# Patient Record
Sex: Female | Born: 1980
Health system: Southern US, Community
[De-identification: ages and names within clinical notes are randomized; demographics above are authoritative.]

## PROBLEM LIST (undated history)

## (undated) DIAGNOSIS — N970 Female infertility associated with anovulation: Secondary | ICD-10-CM

## (undated) DIAGNOSIS — B009 Herpesviral infection, unspecified: Secondary | ICD-10-CM

## (undated) DIAGNOSIS — J302 Other seasonal allergic rhinitis: Secondary | ICD-10-CM

## (undated) DIAGNOSIS — I10 Essential (primary) hypertension: Secondary | ICD-10-CM

## (undated) DIAGNOSIS — E282 Polycystic ovarian syndrome: Secondary | ICD-10-CM

## (undated) DIAGNOSIS — E669 Obesity, unspecified: Secondary | ICD-10-CM

## (undated) DIAGNOSIS — Z8619 Personal history of other infectious and parasitic diseases: Secondary | ICD-10-CM

## (undated) DIAGNOSIS — N83209 Unspecified ovarian cyst, unspecified side: Secondary | ICD-10-CM

## (undated) DIAGNOSIS — N915 Oligomenorrhea, unspecified: Secondary | ICD-10-CM

## (undated) HISTORY — DX: Oligomenorrhea, unspecified: N91.5

## (undated) HISTORY — DX: Polycystic ovarian syndrome: E28.2

## (undated) HISTORY — DX: Herpesviral infection, unspecified: B00.9

## (undated) HISTORY — DX: Female infertility associated with anovulation: N97.0

## (undated) HISTORY — DX: Other seasonal allergic rhinitis: J30.2

## (undated) HISTORY — PX: CYSTECTOMY: SUR359

## (undated) HISTORY — DX: Unspecified ovarian cyst, unspecified side: N83.209

## (undated) HISTORY — DX: Obesity, unspecified: E66.9

## (undated) HISTORY — DX: Essential (primary) hypertension: I10

## (undated) HISTORY — DX: Personal history of other infectious and parasitic diseases: Z86.19

---

## 2000-06-02 ENCOUNTER — Other Ambulatory Visit: Admission: RE | Admit: 2000-06-02 | Discharge: 2000-06-02 | Payer: Self-pay | Admitting: Emergency Medicine

## 2003-06-08 ENCOUNTER — Emergency Department (HOSPITAL_COMMUNITY): Admission: EM | Admit: 2003-06-08 | Discharge: 2003-06-08 | Payer: Self-pay | Admitting: Family Medicine

## 2005-02-28 ENCOUNTER — Emergency Department (HOSPITAL_COMMUNITY): Admission: EM | Admit: 2005-02-28 | Discharge: 2005-02-28 | Payer: Self-pay | Admitting: Emergency Medicine

## 2005-03-21 ENCOUNTER — Emergency Department (HOSPITAL_COMMUNITY): Admission: EM | Admit: 2005-03-21 | Discharge: 2005-03-21 | Payer: Self-pay | Admitting: Emergency Medicine

## 2005-08-11 ENCOUNTER — Other Ambulatory Visit: Admission: RE | Admit: 2005-08-11 | Discharge: 2005-08-11 | Payer: Self-pay | Admitting: Gynecology

## 2006-08-16 ENCOUNTER — Other Ambulatory Visit: Admission: RE | Admit: 2006-08-16 | Discharge: 2006-08-16 | Payer: Self-pay | Admitting: Obstetrics and Gynecology

## 2007-09-27 ENCOUNTER — Other Ambulatory Visit: Admission: RE | Admit: 2007-09-27 | Discharge: 2007-09-27 | Payer: Self-pay | Admitting: Obstetrics and Gynecology

## 2008-08-08 ENCOUNTER — Ambulatory Visit: Payer: Self-pay | Admitting: Family Medicine

## 2008-08-08 DIAGNOSIS — J309 Allergic rhinitis, unspecified: Secondary | ICD-10-CM | POA: Insufficient documentation

## 2008-08-08 DIAGNOSIS — J45909 Unspecified asthma, uncomplicated: Secondary | ICD-10-CM | POA: Insufficient documentation

## 2008-08-08 DIAGNOSIS — E669 Obesity, unspecified: Secondary | ICD-10-CM | POA: Insufficient documentation

## 2008-08-11 ENCOUNTER — Encounter (INDEPENDENT_AMBULATORY_CARE_PROVIDER_SITE_OTHER): Payer: Self-pay | Admitting: *Deleted

## 2008-08-11 LAB — CONVERTED CEMR LAB
ALT: 22 units/L (ref 0–35)
AST: 17 units/L (ref 0–37)
Albumin: 3.3 g/dL — ABNORMAL LOW (ref 3.5–5.2)
Alkaline Phosphatase: 59 units/L (ref 39–117)
BUN: 15 mg/dL (ref 6–23)
Basophils Absolute: 0 10*3/uL (ref 0.0–0.1)
Basophils Relative: 0.6 % (ref 0.0–3.0)
Bilirubin, Direct: 0 mg/dL (ref 0.0–0.3)
CO2: 28 meq/L (ref 19–32)
Calcium: 8.9 mg/dL (ref 8.4–10.5)
Chloride: 108 meq/L (ref 96–112)
Cholesterol: 196 mg/dL (ref 0–200)
Creatinine, Ser: 0.9 mg/dL (ref 0.4–1.2)
Eosinophils Absolute: 0.3 10*3/uL (ref 0.0–0.7)
Eosinophils Relative: 5.2 % — ABNORMAL HIGH (ref 0.0–5.0)
GFR calc non Af Amer: 78.81 mL/min (ref >60)
Glucose, Bld: 96 mg/dL (ref 70–99)
HCT: 39.5 % (ref 36.0–46.0)
HDL: 32.4 mg/dL — ABNORMAL LOW (ref >39.00)
Hemoglobin: 13.3 g/dL (ref 12.0–15.0)
LDL Cholesterol: 134 mg/dL — ABNORMAL HIGH (ref 0–99)
Lymphocytes Relative: 37.2 % (ref 12.0–46.0)
Lymphs Abs: 1.9 10*3/uL (ref 0.7–4.0)
MCHC: 33.8 g/dL (ref 30.0–36.0)
MCV: 90.1 fL (ref 78.0–100.0)
Monocytes Absolute: 0.6 10*3/uL (ref 0.1–1.0)
Monocytes Relative: 10.7 % (ref 3.0–12.0)
Neutro Abs: 2.4 10*3/uL (ref 1.4–7.7)
Neutrophils Relative %: 46.3 % (ref 43.0–77.0)
Platelets: 280 10*3/uL (ref 150.0–400.0)
Potassium: 4.2 meq/L (ref 3.5–5.1)
RBC: 4.38 M/uL (ref 3.87–5.11)
RDW: 13.2 % (ref 11.5–14.6)
Sodium: 141 meq/L (ref 135–145)
TSH: 4.01 microintl units/mL (ref 0.35–5.50)
Total Bilirubin: 0.6 mg/dL (ref 0.3–1.2)
Total CHOL/HDL Ratio: 6
Total Protein: 7.7 g/dL (ref 6.0–8.3)
Triglycerides: 148 mg/dL (ref 0.0–149.0)
VLDL: 29.6 mg/dL (ref 0.0–40.0)
WBC: 5.2 10*3/uL (ref 4.5–10.5)

## 2008-08-21 ENCOUNTER — Telehealth: Payer: Self-pay | Admitting: Family Medicine

## 2008-09-02 ENCOUNTER — Telehealth (INDEPENDENT_AMBULATORY_CARE_PROVIDER_SITE_OTHER): Payer: Self-pay | Admitting: *Deleted

## 2008-09-02 ENCOUNTER — Encounter: Payer: Self-pay | Admitting: Family Medicine

## 2008-09-03 ENCOUNTER — Encounter (INDEPENDENT_AMBULATORY_CARE_PROVIDER_SITE_OTHER): Payer: Self-pay | Admitting: *Deleted

## 2008-09-11 ENCOUNTER — Ambulatory Visit: Payer: Self-pay | Admitting: Family Medicine

## 2009-02-02 ENCOUNTER — Encounter: Payer: Self-pay | Admitting: Family Medicine

## 2009-03-09 ENCOUNTER — Ambulatory Visit: Payer: Self-pay | Admitting: Family

## 2009-03-09 LAB — CONVERTED CEMR LAB
ALT: 17 units/L (ref 0–35)
AST: 15 units/L (ref 0–37)
BUN: 11 mg/dL (ref 6–23)
Basophils Absolute: 0 10*3/uL (ref 0.0–0.1)
Basophils Relative: 0.3 % (ref 0.0–3.0)
CO2: 27 meq/L (ref 19–32)
Calcium: 8.7 mg/dL (ref 8.4–10.5)
Chloride: 105 meq/L (ref 96–112)
Cholesterol: 176 mg/dL (ref 0–200)
Creatinine, Ser: 0.8 mg/dL (ref 0.4–1.2)
Eosinophils Absolute: 0.4 10*3/uL (ref 0.0–0.7)
Eosinophils Relative: 9 % — ABNORMAL HIGH (ref 0.0–5.0)
GFR calc non Af Amer: 89.92 mL/min (ref >60)
Glucose, Bld: 93 mg/dL (ref 70–99)
HCT: 36.9 % (ref 36.0–46.0)
HDL: 30.3 mg/dL — ABNORMAL LOW (ref >39.00)
Hemoglobin: 12.4 g/dL (ref 12.0–15.0)
LDL Cholesterol: 118 mg/dL — ABNORMAL HIGH (ref 0–99)
Lymphocytes Relative: 35.3 % (ref 12.0–46.0)
Lymphs Abs: 1.7 10*3/uL (ref 0.7–4.0)
MCHC: 33.7 g/dL (ref 30.0–36.0)
MCV: 92.3 fL (ref 78.0–100.0)
Monocytes Absolute: 0.4 10*3/uL (ref 0.1–1.0)
Monocytes Relative: 8.5 % (ref 3.0–12.0)
Neutro Abs: 2.4 10*3/uL (ref 1.4–7.7)
Neutrophils Relative %: 46.9 % (ref 43.0–77.0)
Platelets: 347 10*3/uL (ref 150.0–400.0)
Potassium: 4.2 meq/L (ref 3.5–5.1)
RBC: 4 M/uL (ref 3.87–5.11)
RDW: 13.3 % (ref 11.5–14.6)
Sodium: 138 meq/L (ref 135–145)
Total CHOL/HDL Ratio: 6
Triglycerides: 138 mg/dL (ref 0.0–149.0)
VLDL: 27.6 mg/dL (ref 0.0–40.0)
WBC: 4.9 10*3/uL (ref 4.5–10.5)

## 2009-03-10 ENCOUNTER — Encounter: Payer: Self-pay | Admitting: Family

## 2009-03-11 ENCOUNTER — Encounter (INDEPENDENT_AMBULATORY_CARE_PROVIDER_SITE_OTHER): Payer: Self-pay | Admitting: *Deleted

## 2009-03-11 ENCOUNTER — Telehealth (INDEPENDENT_AMBULATORY_CARE_PROVIDER_SITE_OTHER): Payer: Self-pay | Admitting: *Deleted

## 2009-10-12 ENCOUNTER — Telehealth (INDEPENDENT_AMBULATORY_CARE_PROVIDER_SITE_OTHER): Payer: Self-pay | Admitting: *Deleted

## 2010-03-03 ENCOUNTER — Telehealth (INDEPENDENT_AMBULATORY_CARE_PROVIDER_SITE_OTHER): Payer: Self-pay | Admitting: *Deleted

## 2010-05-18 NOTE — Progress Notes (Signed)
Summary: REFILL REQUEST  Phone Note Refill Request Call back at [phone] Message from:  Pharmacy on October 12, 2009 10:48 AM  Refills Requested: Medication #1:  PROAIR HFA 108 (90 BASE) MCG/ACT AERS 2 puffs Q4 as needed for wheezing   Dosage confirmed as above?Dosage Confirmed   Supply Requested: 1 month   Last Refilled: 08/02/2009 [facility] [address]  Next Appointment Scheduled: NONE Initial call taken by: Lavell Islam,  October 12, 2009 10:49 AM  Follow-up for Phone Call        Last appointment with Dr.Tabori 08/2008, additional refills require an appointment Follow-up by: Shonna Chock,  October 12, 2009 4:02 PM    New/Updated Medications: PROAIR HFA 108 (90 BASE) MCG/ACT AERS (ALBUTEROL SULFATE) 2 puffs Q4 as needed for wheezing **APPOINTMENT DUE FOR ADDITIONAL REFILLS** Prescriptions: PROAIR HFA 108 (90 BASE) MCG/ACT AERS (ALBUTEROL SULFATE) 2 puffs Q4 as needed for wheezing **APPOINTMENT DUE FOR ADDITIONAL REFILLS**  #1 x 0   Entered by:   Shonna Chock   Authorized by:   Neena Rhymes MD   Signed by:   Shonna Chock on 10/12/2009   Method used:   Electronically to        [facility] [phone]* (retail)       [address]       [place], [place]  [address]       Ph: [phone]       Fax: [phone]   RxID:   [id number]

## 2010-05-18 NOTE — Progress Notes (Signed)
Summary: proair refill   Phone Note Refill Request Call back at [phone] Message from:  Pharmacy on March 03, 2010 8:11 AM  Refills Requested: Medication #1:  PROAIR HFA 108 (90 BASE) MCG/ACT AERS 2 puffs Q4 as needed for wheezing **APPOINTMENT DUE FOR ADDITIONAL REFILLS**   Dosage confirmed as above?Dosage Confirmed   Last Refilled: 10/12/2009 [facility] [address]   Next Appointment Scheduled: none Initial call taken by: Harold Barban,  March 03, 2010 8:13 AM    Prescriptions: PROAIR HFA 108 (90 BASE) MCG/ACT AERS (ALBUTEROL SULFATE) 2 puffs Q4 as needed for wheezing **APPOINTMENT DUE FOR ADDITIONAL REFILLS**  #1 x 0   Entered by:   Doristine Devoid CMA   Authorized by:   Loreen Freud DO   Signed by:   Doristine Devoid CMA on 03/03/2010   Method used:   Electronically to        [facility] [phone]* (retail)       [address]       [place], [place]  [address]       Ph: [phone]       Fax: [phone]   RxID:   [id number]

## 2010-06-09 ENCOUNTER — Encounter: Payer: Self-pay | Admitting: Family Medicine

## 2010-06-09 ENCOUNTER — Ambulatory Visit (INDEPENDENT_AMBULATORY_CARE_PROVIDER_SITE_OTHER): Payer: 59 | Admitting: Family Medicine

## 2010-06-09 DIAGNOSIS — J209 Acute bronchitis, unspecified: Secondary | ICD-10-CM

## 2010-06-15 NOTE — Assessment & Plan Note (Signed)
Summary: COUGH,CHILLS/RH.....   Vital Signs:  Patient profile:   30 year old female Height:      66 inches Weight:      407 pounds O2 Sat:      95 % on Room air Temp:     98.2 degrees F oral Pulse rate:   115 / minute BP sitting:   110 / 86  (left arm)  Vitals Entered By: Jeremy Johann CMA (June 09, 2010 9:47 AM)  O2 Flow:  Room air CC: COUGH, CONGESTION, CHILLS, PAIN IN BACK AND RIBS [id number]   History of Present Illness: 30 yo woman here today for cough.  'i think i caught the flu last week'.  still having productive cough, chills, sore throat, losing voice.  no ear pain, nasal congestion.  + sick contacts.    Current Medications (verified): 1)  Proair Hfa 108 (90 Base) Mcg/act Aers (Albuterol Sulfate) .... 2 Puffs Q4 As Needed For Wheezing **appointment Due For Additional Refills** 2)  Singulair 10 Mg Tabs (Montelukast Sodium) .... One Tablet By Mouth Daily 3)  Alese .... As Directed 4)  Azithromycin 250 Mg  Tabs (Azithromycin) .... 2 By  Mouth Today and Then 1 Daily For 4 Days 5)  Tessalon 200 Mg Caps (Benzonatate) .... Take One Capsule By Mouth Three Times A Day As Needed For Cough 6)  Cheratussin Ac 100-10 Mg/23ml Syrp (Guaifenesin-Codeine) .Marland Kitchen.. 1-2 Tsps Q4-6 As Needed For Cough. Disp  Allergies (verified): No Known Drug Allergies  Review of Systems      See HPI  Physical Exam  General:  morbidly obese AA female in NAD Head:  Normocephalic and atraumatic without obvious abnormalities. No apparent alopecia or balding.  no TTP over sinuses Eyes:  no injxn or inflammation Ears:  External ear exam shows no significant lesions or deformities.  Otoscopic examination reveals clear canals, tympanic membranes are intact bilaterally without bulging, retraction, inflammation or discharge. Hearing is grossly normal bilaterally. Nose:  edematous turbinates Mouth:  Oral mucosa and oropharynx without lesions or exudates.  Teeth in good repair. Neck:  No deformities,  masses, or tenderness noted. Lungs:  Normal respiratory effort, chest expands symmetrically. Lungs are clear to auscultation, no crackles or wheezes. Heart:  Normal rate and regular rhythm. S1 and S2 normal without gallop, murmur, click, rub or other extra sounds.   Impression & Recommendations:  Problem # 1:  BRONCHITIS- ACUTE (ICD-466.0) Assessment New pt's lungs clear today but given underlying asthma will tx w/ abx.  cough meds as needed.  reviewed supportive care and red flags that should prompt return.  Pt expresses understanding and is in agreement w/ this plan. Her updated medication list for this problem includes:    Proair Hfa 108 (90 Base) Mcg/act Aers (Albuterol sulfate) .Marland Kitchen... 2 puffs q4 as needed for wheezing **appointment due for additional refills**    Singulair 10 Mg Tabs (Montelukast sodium) ..... One tablet by mouth daily    Azithromycin 250 Mg Tabs (Azithromycin) .Marland Kitchen... 2 by  mouth today and then 1 daily for 4 days    Tessalon 200 Mg Caps (Benzonatate) .Marland Kitchen... Take one capsule by mouth three times a day as needed for cough    Cheratussin Ac 100-10 Mg/48ml Syrp (Guaifenesin-codeine) .Marland Kitchen... 1-2 tsps q4-6 as needed for cough. disp  Complete Medication List: 1)  Proair Hfa 108 (90 Base) Mcg/act Aers (Albuterol sulfate) .... 2 puffs q4 as needed for wheezing **appointment due for additional refills** 2)  Singulair 10 Mg Tabs (Montelukast  sodium) .... One tablet by mouth daily 3)  Alese  .... As directed 4)  Azithromycin 250 Mg Tabs (Azithromycin) .... 2 by  mouth today and then 1 daily for 4 days 5)  Tessalon 200 Mg Caps (Benzonatate) .... Take one capsule by mouth three times a day as needed for cough 6)  Cheratussin Ac 100-10 Mg/37ml Syrp (Guaifenesin-codeine) .Marland Kitchen.. 1-2 tsps q4-6 as needed for cough. disp  Patient Instructions: 1)  Please schedule your complete physical in 1-2 months- do not eat before this appt 2)  Take the Azithromycin as directed for your  bronchitis 3)  Use the cough pills for daytime cough and the syrup for night cough 4)  Continue the Mucinex to thin your congestion 5)  Use your inhaler as needed 6)  Hang in there!!! Prescriptions: CHERATUSSIN AC 100-10 MG/5ML SYRP (GUAIFENESIN-CODEINE) 1-2 tsps Q4-6 as needed for cough. disp  #150 x 0   Entered and Authorized by:   Neena Rhymes MD   Signed by:   Neena Rhymes MD on 06/09/2010   Method used:   Print then Give to Patient   RxID:   [id number] PROAIR HFA 108 (90 BASE) MCG/ACT AERS (ALBUTEROL SULFATE) 2 puffs Q4 as needed for wheezing **APPOINTMENT DUE FOR ADDITIONAL REFILLS**  #1 x 3   Entered and Authorized by:   Neena Rhymes MD   Signed by:   Neena Rhymes MD on 06/09/2010   Method used:   Electronically to        [facility] [phone]* (retail)       [address]       [place], [place]  [address]       Ph: [phone]       Fax: [phone]   RxID:   [id number] TESSALON 200 MG CAPS (BENZONATATE) Take one capsule by mouth three times a day as needed for cough  #60 x 0   Entered and Authorized by:   Neena Rhymes MD   Signed by:   Neena Rhymes MD on 06/09/2010   Method used:   Electronically to        [facility] [phone]* (retail)       [address]       [place], [place]  [address]       Ph: [phone]       Fax: [phone]   RxID:   [id number] AZITHROMYCIN 250 MG  TABS (AZITHROMYCIN) 2 by  mouth today and then 1 daily for 4 days  #6 x 0   Entered and Authorized by:   Neena Rhymes MD   Signed by:   Neena Rhymes MD on 06/09/2010   Method used:   Electronically to        [facility] [phone]* (retail)       [address]       [place], [place]  [address]       Ph: [phone]       Fax: [phone]   RxID:   [id number]    Orders Added: 1)  Est. Patient Level III [18299]

## 2010-09-27 ENCOUNTER — Encounter: Payer: Self-pay | Admitting: Family Medicine

## 2010-09-27 ENCOUNTER — Ambulatory Visit (INDEPENDENT_AMBULATORY_CARE_PROVIDER_SITE_OTHER): Payer: 59 | Admitting: Family Medicine

## 2010-09-27 ENCOUNTER — Encounter: Payer: Self-pay | Admitting: *Deleted

## 2010-09-27 DIAGNOSIS — E669 Obesity, unspecified: Secondary | ICD-10-CM

## 2010-09-27 DIAGNOSIS — Z0000 Encounter for general adult medical examination without abnormal findings: Secondary | ICD-10-CM

## 2010-09-27 LAB — CBC WITH DIFFERENTIAL/PLATELET
Basophils Absolute: 0 10*3/uL (ref 0.0–0.1)
Basophils Relative: 0.6 % (ref 0.0–3.0)
Eosinophils Absolute: 0.2 10*3/uL (ref 0.0–0.7)
Eosinophils Relative: 6.1 % — ABNORMAL HIGH (ref 0.0–5.0)
HCT: 41.2 % (ref 36.0–46.0)
Hemoglobin: 14.1 g/dL (ref 12.0–15.0)
Lymphocytes Relative: 41.8 % (ref 12.0–46.0)
Lymphs Abs: 1.7 10*3/uL (ref 0.7–4.0)
MCHC: 34.1 g/dL (ref 30.0–36.0)
MCV: 88.9 fl (ref 78.0–100.0)
Monocytes Absolute: 0.4 10*3/uL (ref 0.1–1.0)
Monocytes Relative: 10.2 % (ref 3.0–12.0)
Neutro Abs: 1.6 10*3/uL (ref 1.4–7.7)
Neutrophils Relative %: 41.3 % — ABNORMAL LOW (ref 43.0–77.0)
Platelets: 339 10*3/uL (ref 150.0–400.0)
RBC: 4.64 Mil/uL (ref 3.87–5.11)
RDW: 14.5 % (ref 11.5–14.6)
WBC: 4 10*3/uL — ABNORMAL LOW (ref 4.5–10.5)

## 2010-09-27 LAB — BASIC METABOLIC PANEL
BUN: 10 mg/dL (ref 6–23)
CO2: 26 mEq/L (ref 19–32)
Calcium: 9.1 mg/dL (ref 8.4–10.5)
Chloride: 108 mEq/L (ref 96–112)
Creatinine, Ser: 0.9 mg/dL (ref 0.4–1.2)
GFR: 80.77 mL/min (ref >60.00)
Glucose, Bld: 99 mg/dL (ref 70–99)
Potassium: 4.5 mEq/L (ref 3.5–5.1)
Sodium: 139 mEq/L (ref 135–145)

## 2010-09-27 LAB — LIPID PANEL
Cholesterol: 198 mg/dL (ref 0–200)
HDL: 34.5 mg/dL — ABNORMAL LOW (ref >39.00)
LDL Cholesterol: 128 mg/dL — ABNORMAL HIGH (ref 0–99)
Total CHOL/HDL Ratio: 6
Triglycerides: 180 mg/dL — ABNORMAL HIGH (ref 0.0–149.0)
VLDL: 36 mg/dL (ref 0.0–40.0)

## 2010-09-27 LAB — HEPATIC FUNCTION PANEL
ALT: 22 U/L (ref 0–35)
AST: 19 U/L (ref 0–37)
Albumin: 3.5 g/dL (ref 3.5–5.2)
Alkaline Phosphatase: 58 U/L (ref 39–117)
Bilirubin, Direct: 0.1 mg/dL (ref 0.0–0.3)
Total Bilirubin: 0.4 mg/dL (ref 0.3–1.2)
Total Protein: 7.8 g/dL (ref 6.0–8.3)

## 2010-09-27 LAB — TSH: TSH: 1.6 u[IU]/mL (ref 0.35–5.50)

## 2010-09-27 NOTE — Assessment & Plan Note (Signed)
Again discussed that pt's biggest health concern needs to be her weight.  Stressed importance of healthy diet and regular exercise.  Discussed gastric bypass but pt reports insurance will not pay for this.  Will follow closely.

## 2010-09-27 NOTE — Progress Notes (Signed)
  Subjective:    Patient ID: Taylor Frye, female    DOB: [birth year], 30 y.o.   MRN: [id number]  HPI CPE- pap done by [facility].  No concerns.   Review of Systems Patient reports no vision/ hearing changes, adenopathy,fever, weight change,  persistant/recurrent hoarseness , swallowing issues, chest pain, palpitations, edema, persistant/recurrent cough, hemoptysis, dyspnea (rest/exertional/paroxysmal nocturnal), gastrointestinal bleeding (melena, rectal bleeding), abdominal pain, significant heartburn, bowel changes, GU symptoms (dysuria, hematuria, incontinence), Gyn symptoms (abnormal  bleeding, pain),  syncope, focal weakness, memory loss, numbness & tingling, skin/hair/nail changes, abnormal bruising or bleeding, anxiety, or depression.     Objective:   Physical Exam  General Appearance:    Alert, cooperative, no distress, appears stated age, morbidly obese  Head:    Normocephalic, without obvious abnormality, atraumatic  Eyes:    PERRL, conjunctiva/corneas clear, EOM's intact, fundi    benign, both eyes  Ears:    Normal TM's and external ear canals, both ears  Nose:   Nares normal, septum midline, mucosa normal, no drainage    or sinus tenderness  Throat:   Lips, mucosa, and tongue normal; teeth and gums normal  Neck:   Supple, symmetrical, trachea midline, no adenopathy;    Thyroid: no enlargement/tenderness/nodules  Back:     Symmetric, no curvature, ROM normal, no CVA tenderness  Lungs:     Clear to auscultation bilaterally, respirations unlabored  Chest Wall:    No tenderness or deformity   Heart:    Regular rate and rhythm, S1 and S2 normal, no murmur, rub   or gallop  Breast Exam:    Deferred to gyn  Abdomen:     Soft, non-tender, bowel sounds active all four quadrants,    no masses, no organomegaly  Genitalia:   deferred to GYN     Extremities:   Extremities normal, atraumatic, no cyanosis or edema  Pulses:   2+ and symmetric all extremities  Skin:   Skin color,  texture, turgor normal, no rashes or lesions  Lymph nodes:   Cervical, supraclavicular, and axillary nodes normal  Neurologic:   CNII-XII intact, normal strength, sensation and reflexes    throughout          Assessment & Plan:

## 2010-09-27 NOTE — Patient Instructions (Signed)
Follow up in 3 months to recheck weight loss progress Try and increase your exercise- walk away the pounds is a great choice! Try and make healthy food choices We'll notify you of your lab results Call with any questions or concerns Have a great summer!!!

## 2010-09-27 NOTE — Assessment & Plan Note (Signed)
Pt's PE WNL w/ exception of morbid obesity.  Check labs.  UTD on health maintenance.  Anticipatory guidance provided.

## 2010-09-29 ENCOUNTER — Encounter: Payer: Self-pay | Admitting: *Deleted

## 2010-09-29 LAB — VITAMIN D 1,25 DIHYDROXY
Vitamin D 1, 25 (OH)2 Total: 50 pg/mL (ref 18–72)
Vitamin D2 1, 25 (OH)2: <8 pg/mL
Vitamin D3 1, 25 (OH)2: 50 pg/mL

## 2011-03-22 ENCOUNTER — Other Ambulatory Visit: Payer: Self-pay | Admitting: Family Medicine

## 2011-03-22 MED ORDER — ALBUTEROL SULFATE HFA 108 (90 BASE) MCG/ACT IN AERS: 2.0000 | INHALATION_SPRAY | RESPIRATORY_TRACT | Status: DC | PRN

## 2011-03-22 NOTE — Telephone Encounter (Signed)
rx sent to pharmacy by e-script For inhaler

## 2011-04-27 ENCOUNTER — Other Ambulatory Visit: Payer: Self-pay | Admitting: Family Medicine

## 2011-04-27 MED ORDER — ALBUTEROL SULFATE HFA 108 (90 BASE) MCG/ACT IN AERS: 2.0000 | INHALATION_SPRAY | RESPIRATORY_TRACT | Status: DC | PRN

## 2011-04-27 NOTE — Telephone Encounter (Signed)
rx sent to pharmacy by e-script  

## 2011-10-25 ENCOUNTER — Telehealth: Payer: Self-pay | Admitting: Family Medicine

## 2011-10-25 NOTE — Telephone Encounter (Signed)
Refill: Proair HFA (200 puffs) 8.5gm dos ctr. Inhale 2 puffs by mouth every 4 hours as needed. Qty 8.5. Last fill 09-14-11

## 2011-10-26 MED ORDER — ALBUTEROL SULFATE HFA 108 (90 BASE) MCG/ACT IN AERS: 2.0000 | INHALATION_SPRAY | RESPIRATORY_TRACT | Status: DC | PRN

## 2011-10-26 NOTE — Telephone Encounter (Signed)
Refill done. Pt has future appt.

## 2011-12-06 ENCOUNTER — Ambulatory Visit (INDEPENDENT_AMBULATORY_CARE_PROVIDER_SITE_OTHER): Payer: BC Managed Care – PPO | Admitting: Family Medicine

## 2011-12-06 ENCOUNTER — Encounter: Payer: Self-pay | Admitting: *Deleted

## 2011-12-06 ENCOUNTER — Encounter: Payer: Self-pay | Admitting: Family Medicine

## 2011-12-06 VITALS — BP 127/84 | HR 77 | Temp 97.3°F | Ht 66.25 in | Wt 395.4 lb

## 2011-12-06 DIAGNOSIS — M25569 Pain in unspecified knee: Secondary | ICD-10-CM

## 2011-12-06 DIAGNOSIS — Z0000 Encounter for general adult medical examination without abnormal findings: Secondary | ICD-10-CM

## 2011-12-06 DIAGNOSIS — J45909 Unspecified asthma, uncomplicated: Secondary | ICD-10-CM

## 2011-12-06 DIAGNOSIS — Z23 Encounter for immunization: Secondary | ICD-10-CM

## 2011-12-06 LAB — LIPID PANEL
Cholesterol: 179 mg/dL (ref 0–200)
HDL: 39.3 mg/dL (ref >39.00)
LDL Cholesterol: 105 mg/dL — ABNORMAL HIGH (ref 0–99)
Total CHOL/HDL Ratio: 5
Triglycerides: 176 mg/dL — ABNORMAL HIGH (ref 0.0–149.0)
VLDL: 35.2 mg/dL (ref 0.0–40.0)

## 2011-12-06 LAB — BASIC METABOLIC PANEL
BUN: 13 mg/dL (ref 6–23)
CO2: 28 mEq/L (ref 19–32)
Calcium: 9.1 mg/dL (ref 8.4–10.5)
Chloride: 105 mEq/L (ref 96–112)
Creatinine, Ser: 0.8 mg/dL (ref 0.4–1.2)
GFR: 83.47 mL/min (ref >60.00)
Glucose, Bld: 92 mg/dL (ref 70–99)
Potassium: 3.8 mEq/L (ref 3.5–5.1)
Sodium: 139 mEq/L (ref 135–145)

## 2011-12-06 LAB — HEPATIC FUNCTION PANEL
ALT: 22 U/L (ref 0–35)
AST: 18 U/L (ref 0–37)
Albumin: 3.5 g/dL (ref 3.5–5.2)
Alkaline Phosphatase: 57 U/L (ref 39–117)
Bilirubin, Direct: 0 mg/dL (ref 0.0–0.3)
Total Bilirubin: 0.4 mg/dL (ref 0.3–1.2)
Total Protein: 7.7 g/dL (ref 6.0–8.3)

## 2011-12-06 LAB — CBC WITH DIFFERENTIAL/PLATELET
Basophils Absolute: 0 10*3/uL (ref 0.0–0.1)
Basophils Relative: 0.5 % (ref 0.0–3.0)
Eosinophils Absolute: 0.2 10*3/uL (ref 0.0–0.7)
Eosinophils Relative: 4.9 % (ref 0.0–5.0)
HCT: 42.1 % (ref 36.0–46.0)
Hemoglobin: 14 g/dL (ref 12.0–15.0)
Lymphocytes Relative: 34.3 % (ref 12.0–46.0)
Lymphs Abs: 1.4 10*3/uL (ref 0.7–4.0)
MCHC: 33.3 g/dL (ref 30.0–36.0)
MCV: 90.8 fl (ref 78.0–100.0)
Monocytes Absolute: 0.4 10*3/uL (ref 0.1–1.0)
Monocytes Relative: 11.3 % (ref 3.0–12.0)
Neutro Abs: 2 10*3/uL (ref 1.4–7.7)
Neutrophils Relative %: 49 % (ref 43.0–77.0)
Platelets: 294 10*3/uL (ref 150.0–400.0)
RBC: 4.64 Mil/uL (ref 3.87–5.11)
RDW: 13.5 % (ref 11.5–14.6)
WBC: 4 10*3/uL — ABNORMAL LOW (ref 4.5–10.5)

## 2011-12-06 LAB — TSH: TSH: 2.18 u[IU]/mL (ref 0.35–5.50)

## 2011-12-06 MED ORDER — BECLOMETHASONE DIPROPIONATE 80 MCG/ACT IN AERS: 1.0000 | INHALATION_SPRAY | Freq: Two times a day (BID) | RESPIRATORY_TRACT | Status: DC

## 2011-12-06 MED ORDER — MONTELUKAST SODIUM 10 MG PO TABS: 10.0000 mg | ORAL_TABLET | Freq: Every day | ORAL | Status: DC

## 2011-12-06 MED ORDER — TETANUS-DIPHTH-ACELL PERTUSSIS 5-2.5-18.5 LF-MCG/0.5 IM SUSP
0.5000 mL | Freq: Once | INTRAMUSCULAR | Status: AC
  Administered 2011-12-06: 0.5 mL via INTRAMUSCULAR

## 2011-12-06 MED ORDER — MELOXICAM 15 MG PO TABS: 15.0000 mg | ORAL_TABLET | Freq: Every day | ORAL | Status: DC

## 2011-12-06 NOTE — Progress Notes (Signed)
  Subjective:    Patient ID: Taylor Frye, female    DOB: 10-Jan-1981, 31 y.o.   MRN: [id number]  HPI CPE- UTD on GYN.  Asthma- chronic problem, has been relying on rescue inhaler b/c she lost insurance, but was overusing rescue inhaler which was causing her to feel 'jittery' and elevated BP.  Previously was controlling sxs w/ daily singulair.  At one point this summer was using inhaler 4-5x/day.  sxs were waking pt from sleep.  Knee pain- R knee, sxs started 1 month ago.  Pain occuring daily.  + family hx of gout, arthritis.  Using topical tiger balm w/ some relief.  Will use OTC arthritis meds w/ some relief.  Pain is localized to patella.  Pain worsens w/ walking and stairs.  Pain improves w/ rest.  No swelling.   Review of Systems Patient reports no vision/ hearing changes, adenopathy,fever, weight change,  persistant/recurrent hoarseness , swallowing issues, chest pain, palpitations, edema, persistant/recurrent cough, hemoptysis, gastrointestinal bleeding (melena, rectal bleeding), abdominal pain, significant heartburn, bowel changes, GU symptoms (dysuria, hematuria, incontinence), Gyn symptoms (abnormal  bleeding, pain),  syncope, focal weakness, memory loss, numbness & tingling, skin/hair/nail changes, abnormal bruising or bleeding, anxiety, or depression.     Objective:   Physical Exam General Appearance:    Alert, cooperative, no distress, appears stated age, morbidly obese  Head:    Normocephalic, without obvious abnormality, atraumatic  Eyes:    PERRL, conjunctiva/corneas clear, EOM's intact, fundi    benign, both eyes  Ears:    Normal TM's and external ear canals, both ears  Nose:   Nares normal, septum midline, mucosa normal, no drainage    or sinus tenderness  Throat:   Lips, mucosa, and tongue normal; teeth and gums normal  Neck:   Supple, symmetrical, trachea midline, no adenopathy;    Thyroid: no enlargement/tenderness/nodules  Back:     Symmetric, no curvature, ROM  normal, no CVA tenderness  Lungs:     Clear to auscultation bilaterally, respirations unlabored  Chest Wall:    No tenderness or deformity   Heart:    Regular rate and rhythm, S1 and S2 normal, no murmur, rub   or gallop  Breast Exam:    Deferred to GYN  Abdomen:     Soft, non-tender, bowel sounds active all four quadrants,    no masses, no organomegaly  Genitalia:    Deferred to GYN  Rectal:    Extremities:   Extremities normal, atraumatic, no cyanosis or edema.  No TTP along joint line of R knee, no crepitus w/ flexion/extension.  + TTP over patellar tendon.  Difficult exam due to body habitus  Pulses:   2+ and symmetric all extremities  Skin:   Skin color, texture, turgor normal, no rashes or lesions  Lymph nodes:   Cervical, supraclavicular, and axillary nodes normal  Neurologic:   CNII-XII intact, normal strength, sensation and reflexes    throughout          Assessment & Plan:

## 2011-12-06 NOTE — Assessment & Plan Note (Signed)
Deteriorated.  Pt is overusing rescue inhaler since stopping controller med.  Restart Singulair.  Add Qvar.  Reviewed supportive care and red flags that should prompt return.  Pt expressed understanding and is in agreement w/ plan.

## 2011-12-06 NOTE — Assessment & Plan Note (Signed)
Pt's PE WNL w/ exception of morbid obesity.  Discussed the risk to her health that this poses- pt aware.  Encouraged healthy diet and regular exercise.  Will follow closely.

## 2011-12-06 NOTE — Assessment & Plan Note (Signed)
New.  Likely due to pt's body habitus and morbid obesity.  Stressed importance of healthy diet, regular activity.  Start daily NSAID for pain relief.  Reviewed supportive care and red flags that should prompt return.  Pt expressed understanding and is in agreement w/ plan.

## 2011-12-06 NOTE — Patient Instructions (Signed)
We'll notify you of your lab results Start the Qvar inhaler- 1 puff twice daily- as your controller med Continue the Albuterol as needed Restart the Singulair daily Start the Mobic daily for the knee pain Try and get regular exercise- this will improve both knee stiffness and help w/ weight loss Call with any questions or concerns- particularly if the knee is not improving! Happy [birth year] Birthday!!

## 2011-12-08 LAB — VITAMIN D 1,25 DIHYDROXY
Vitamin D 1, 25 (OH)2 Total: 41 pg/mL (ref 18–72)
Vitamin D2 1, 25 (OH)2: <8 pg/mL
Vitamin D3 1, 25 (OH)2: 41 pg/mL

## 2011-12-09 ENCOUNTER — Encounter: Payer: Self-pay | Admitting: *Deleted

## 2012-01-23 ENCOUNTER — Other Ambulatory Visit: Payer: Self-pay | Admitting: Family Medicine

## 2012-01-23 MED ORDER — MELOXICAM 15 MG PO TABS: 15.0000 mg | ORAL_TABLET | Freq: Every day | ORAL | Status: AC

## 2012-01-23 NOTE — Telephone Encounter (Signed)
Refill Meloxicam (Tab) 15 MG Take 1 tablet (15 mg total) by mouth every day #30 last fill 9.8.13, last ov 8.20.13 V70

## 2012-01-23 NOTE — Telephone Encounter (Signed)
Last OV and refill 12-06-11 #30 with 0 refills

## 2012-01-23 NOTE — Telephone Encounter (Signed)
rx sent to pharmacy by e-script  

## 2012-01-23 NOTE — Telephone Encounter (Signed)
Ok for #30, 1 refill 

## 2012-01-27 ENCOUNTER — Telehealth: Payer: Self-pay | Admitting: Family Medicine

## 2012-01-27 DIAGNOSIS — M25569 Pain in unspecified knee: Secondary | ICD-10-CM

## 2012-01-27 NOTE — Telephone Encounter (Signed)
Pt advised that her knee still hurting, was advised at 8-13 apt to call back to get next steps, please advise

## 2012-01-27 NOTE — Telephone Encounter (Signed)
Pt still in pain and would like a referral, pls call pt

## 2012-01-29 NOTE — Telephone Encounter (Signed)
Ok for ortho referral- dx knee pain

## 2012-01-30 NOTE — Telephone Encounter (Signed)
Placed referral in chart for ortho, pt aware someone will call her about the apt date and time

## 2012-02-03 DIAGNOSIS — N83209 Unspecified ovarian cyst, unspecified side: Secondary | ICD-10-CM

## 2012-02-03 DIAGNOSIS — J302 Other seasonal allergic rhinitis: Secondary | ICD-10-CM | POA: Insufficient documentation

## 2012-02-03 DIAGNOSIS — E282 Polycystic ovarian syndrome: Secondary | ICD-10-CM

## 2012-02-03 DIAGNOSIS — Z6841 Body Mass Index (BMI) 40.0 and over, adult: Secondary | ICD-10-CM | POA: Insufficient documentation

## 2012-02-03 DIAGNOSIS — N970 Female infertility associated with anovulation: Secondary | ICD-10-CM

## 2012-02-03 DIAGNOSIS — E669 Obesity, unspecified: Secondary | ICD-10-CM

## 2012-02-03 DIAGNOSIS — N915 Oligomenorrhea, unspecified: Secondary | ICD-10-CM | POA: Insufficient documentation

## 2012-02-03 DIAGNOSIS — B009 Herpesviral infection, unspecified: Secondary | ICD-10-CM | POA: Insufficient documentation

## 2012-02-08 ENCOUNTER — Encounter: Payer: Self-pay | Admitting: Obstetrics and Gynecology

## 2012-02-08 ENCOUNTER — Ambulatory Visit (INDEPENDENT_AMBULATORY_CARE_PROVIDER_SITE_OTHER): Payer: BC Managed Care – PPO | Admitting: Obstetrics and Gynecology

## 2012-02-08 VITALS — BP 132/72 | Ht 65.0 in | Wt >= 6400 oz

## 2012-02-08 DIAGNOSIS — N912 Amenorrhea, unspecified: Secondary | ICD-10-CM

## 2012-02-08 DIAGNOSIS — Z113 Encounter for screening for infections with a predominantly sexual mode of transmission: Secondary | ICD-10-CM

## 2012-02-08 DIAGNOSIS — E282 Polycystic ovarian syndrome: Secondary | ICD-10-CM

## 2012-02-08 DIAGNOSIS — Z124 Encounter for screening for malignant neoplasm of cervix: Secondary | ICD-10-CM

## 2012-02-08 LAB — POCT URINE PREGNANCY: Preg Test, Ur: NEGATIVE

## 2012-02-08 MED ORDER — LEVONORGESTREL-ETHINYL ESTRAD 0.1-20 MG-MCG PO TABS: 1.0000 | ORAL_TABLET | Freq: Every day | ORAL | Status: DC

## 2012-02-08 MED ORDER — MEDROXYPROGESTERONE ACETATE 5 MG PO TABS: 5.0000 mg | ORAL_TABLET | Freq: Every day | ORAL | Status: DC

## 2012-02-08 NOTE — Progress Notes (Signed)
ANNUAL:  Last Pap: 02/23/2010 WNL: Yes no hx high grade lesion Regular Periods:no Contraception: none  Monthly Breast exam:yes Tetanus<46yrs:yes Nl.Bladder Function:yes Daily BMs:yes Healthy Diet:yes Calcium:no Mammogram:no Date of Mammogram: n/a Exercise:yes Have often Exercise: 4 times weekly Seatbelt: yes Abuse at home: no Stressful work:no Sigmoid-colonoscopy: n/a Bone Density: No PCP: Dr. Beverely Low Change in PMH: None Change in [facility]: None  Subjective:    Taylor Frye is a 31 y.o. female, G0P0000, who presents for an annual exam. She discontinued her bcp's in July and has had no menses since.  Now has insurance and can afford them.  Wants to restart    History   Social History  . Marital Status: Single    Spouse Name: N/A    Number of Children: N/A  . Years of Education: N/A   Social History Main Topics  . Smoking status: Never Smoker   . Smokeless tobacco: Never Used  . Alcohol Use: Yes     occ.  . Drug Use: No  . Sexually Active: Yes    Birth Control/ Protection: None   Other Topics Concern  . None   Social History Narrative  . None    Menstrual cycle:   LMP: No LMP recorded. Patient is not currently having periods (Reason: Other).           Cycle: see above  The following portions of the patient's history were reviewed and updated as appropriate: allergies, current medications, past family history, past medical history, past social history, past surgical history and problem list.  Review of Systems Pertinent items are noted in HPI. Breast:Negative for breast lump,nipple discharge or nipple retraction Gastrointestinal: Negative for abdominal pain, change in bowel habits or rectal bleeding Urinary:negative   Objective:    There were no vitals taken for this visit.    Weight:  Wt Readings from Last 1 Encounters:  12/06/11 395 lb 6.4 oz (179.352 kg)          BMI: There is no height or weight on file to calculate BMI.  General Appearance:  Alert, appropriate appearance for age. No acute distress HEENT: Grossly normal Neck / Thyroid: Supple, no masses, nodes or enlargement Lungs: clear to auscultation bilaterally Back: No CVA tenderness Breast Exam: No masses or nodes.No dimpling, nipple retraction or discharge. Cardiovascular: Regular rate and rhythm. S1, S2, no murmur Gastrointestinal: Soft, non-tender, no masses or organomegaly Pelvic Exam: External genitalia: normal general appearance Vaginal: normal rugae Cervix: normal appearance Adnexa: non palpable Uterus: doesn't feel enlarged Exam limited by body habitus Rectovaginal: normal rectal, no masses Lymphatic Exam: Non-palpable nodes in neck, clavicular, axillary, or inguinal regions Skin: no rash or abnormalities Neurologic: Normal gait and speech, no tremor  Psychiatric: Alert and oriented, appropriate affect.   Wet Prep:not applicable Urinalysis:not applicable UPT: Negative,    Assessment:    pcos  Oligomenorrhea well managed on bcp's Morbid obesity   Plan:    pap smear withh HR HPV Provera for 5 days.  Start Alesse on Day 1 of menses return annually or prn STD screening: agreed Contraception:no method.  Will restart bcp's on withdrawal from [facility]  Dierdre Forth MD

## 2012-02-09 LAB — PAP IG, CT-NG, RFX HPV ASCU
Chlamydia Probe Amp: NEGATIVE
GC Probe Amp: NEGATIVE

## 2012-02-09 LAB — RPR

## 2012-02-09 LAB — HIV ANTIBODY (ROUTINE TESTING W REFLEX): HIV: NONREACTIVE

## 2012-04-17 ENCOUNTER — Other Ambulatory Visit: Payer: Self-pay | Admitting: *Deleted

## 2012-04-17 MED ORDER — ALBUTEROL SULFATE HFA 108 (90 BASE) MCG/ACT IN AERS: 2.0000 | INHALATION_SPRAY | RESPIRATORY_TRACT | Status: DC | PRN

## 2012-04-17 NOTE — Telephone Encounter (Signed)
Rx sent,Left Pt detail message.

## 2012-08-09 ENCOUNTER — Ambulatory Visit (INDEPENDENT_AMBULATORY_CARE_PROVIDER_SITE_OTHER): Payer: BC Managed Care – PPO | Admitting: Family Medicine

## 2012-08-09 ENCOUNTER — Encounter: Payer: Self-pay | Admitting: Family Medicine

## 2012-08-09 VITALS — BP 108/70 | HR 78 | Temp 98.7°F | Ht 65.5 in | Wt >= 6400 oz

## 2012-08-09 DIAGNOSIS — M545 Low back pain, unspecified: Secondary | ICD-10-CM

## 2012-08-09 DIAGNOSIS — R609 Edema, unspecified: Secondary | ICD-10-CM

## 2012-08-09 LAB — POCT URINALYSIS DIPSTICK
Bilirubin, UA: NEGATIVE
Blood, UA: NEGATIVE
Glucose, UA: NEGATIVE
Ketones, UA: NEGATIVE
Leukocytes, UA: NEGATIVE
Nitrite, UA: NEGATIVE
Spec Grav, UA: 1.015
Urobilinogen, UA: 0.2
pH, UA: 6.5

## 2012-08-09 NOTE — Patient Instructions (Addendum)
Continue to take ibuprofen as needed for back pain Make healthy food choices and start to do some regular activity- walking is a great place to start.  Any weight that you lose will improve your back pain Drink plenty of fluids We'll notify you of your lab results Call with any questions or concerns Hang in there!

## 2012-08-09 NOTE — Progress Notes (Signed)
  Subjective:    Patient ID: Taylor Frye, female    DOB: 19-Jun-1980, 32 y.o.   MRN: [id number]  HPI Back pain- went to ER in [place] and was told she had 'back spasms' and was taken out of work x2 days.  Returned to work and reports pain is 90% better.  Will still have intermittent pain.  At the time, was given muscle relaxers and pain meds (type unknown).  Now taking ibuprofen as needed for pain w/ good relief.  Pain is in lower back, no radiation, no numbness or tingling of legs, no bowel or bladder incontinence  Leg edema- bilateral foot swelling, intermittent x2 weeks.  Swelling will improve w/ elevation.  Increased urinary frequency.  No burning w/ urination, no odor.  + urgency.  Pt admits concern for possible diabetes.   Review of Systems For ROS see HPI     Objective:   Physical Exam  Vitals reviewed. Constitutional: She is oriented to person, place, and time. She appears well-developed and well-nourished. No distress.  Morbidly obese  Neck: Neck supple.  Cardiovascular: Normal rate, regular rhythm, normal heart sounds and intact distal pulses.   Pulmonary/Chest: Effort normal and breath sounds normal. No respiratory distress. She has no wheezes. She has no rales.  Musculoskeletal: She exhibits no edema.  Limited flexion and extension of spine due to body habitus but no pain No SLR  Lymphadenopathy:    She has no cervical adenopathy.  Neurological: She is alert and oriented to person, place, and time. No cranial nerve deficit. Coordination normal.  Skin: Skin is warm and dry.          Assessment & Plan:

## 2012-08-10 LAB — BASIC METABOLIC PANEL
BUN: 15 mg/dL (ref 6–23)
CO2: 22 mEq/L (ref 19–32)
Calcium: 8.6 mg/dL (ref 8.4–10.5)
Chloride: 104 mEq/L (ref 96–112)
Creatinine, Ser: 1.1 mg/dL (ref 0.4–1.2)
GFR: 62.86 mL/min (ref >60.00)
Glucose, Bld: 84 mg/dL (ref 70–99)
Potassium: 3.7 mEq/L (ref 3.5–5.1)
Sodium: 136 mEq/L (ref 135–145)

## 2012-08-11 NOTE — Assessment & Plan Note (Addendum)
New.  No evidence of pitting edema today.  Suspect this is due to pt's morbid obesity, Na intake, and recent heat.  Check BMP to r/o hyperglycemia or other electrolyte abnormalities.  Reviewed lifestyle modifications and importance of weight loss.  Will follow.

## 2012-08-11 NOTE — Assessment & Plan Note (Addendum)
New.  No evidence of UTI.  Pt w/ parapsinal spasm/strain.  Getting adequate relief w/ OTC NSAIDs.  Stressed importance of weight loss to decrease pressure on spine.  Will follow.

## 2012-08-13 ENCOUNTER — Encounter: Payer: Self-pay | Admitting: General Practice

## 2012-10-22 ENCOUNTER — Other Ambulatory Visit: Payer: Self-pay | Admitting: Family Medicine

## 2012-12-01 ENCOUNTER — Other Ambulatory Visit: Payer: Self-pay | Admitting: Family Medicine

## 2012-12-03 NOTE — Telephone Encounter (Signed)
Med filled.  

## 2012-12-21 ENCOUNTER — Other Ambulatory Visit: Payer: Self-pay | Admitting: Family Medicine

## 2012-12-21 NOTE — Telephone Encounter (Signed)
Medication fill. SW, CMA

## 2013-01-04 ENCOUNTER — Other Ambulatory Visit: Payer: Self-pay | Admitting: Family Medicine

## 2013-01-04 NOTE — Telephone Encounter (Signed)
Rx already filled. SW, CMA

## 2013-01-25 ENCOUNTER — Other Ambulatory Visit: Payer: Self-pay | Admitting: General Practice

## 2013-01-25 MED ORDER — MONTELUKAST SODIUM 10 MG PO TABS: 10.0000 mg | ORAL_TABLET | Freq: Every day | ORAL | Status: DC

## 2013-02-21 ENCOUNTER — Other Ambulatory Visit: Payer: Self-pay

## 2013-03-19 ENCOUNTER — Telehealth: Payer: Self-pay

## 2013-03-19 NOTE — Telephone Encounter (Signed)
Patient at work until 5p. Left message with mom of appt time

## 2013-03-20 ENCOUNTER — Encounter: Payer: Self-pay | Admitting: Family Medicine

## 2013-03-20 ENCOUNTER — Ambulatory Visit (INDEPENDENT_AMBULATORY_CARE_PROVIDER_SITE_OTHER): Payer: BC Managed Care – PPO | Admitting: Family Medicine

## 2013-03-20 VITALS — BP 124/84 | HR 80 | Temp 98.6°F | Resp 18 | Ht 66.5 in | Wt >= 6400 oz

## 2013-03-20 DIAGNOSIS — Z0000 Encounter for general adult medical examination without abnormal findings: Secondary | ICD-10-CM

## 2013-03-20 DIAGNOSIS — Z23 Encounter for immunization: Secondary | ICD-10-CM

## 2013-03-20 DIAGNOSIS — Z6841 Body Mass Index (BMI) 40.0 and over, adult: Secondary | ICD-10-CM

## 2013-03-20 LAB — HEPATIC FUNCTION PANEL
ALT: 18 U/L (ref 0–35)
AST: 15 U/L (ref 0–37)
Albumin: 3.4 g/dL — ABNORMAL LOW (ref 3.5–5.2)
Alkaline Phosphatase: 51 U/L (ref 39–117)
Bilirubin, Direct: 0 mg/dL (ref 0.0–0.3)
Total Bilirubin: 0.4 mg/dL (ref 0.3–1.2)
Total Protein: 7.8 g/dL (ref 6.0–8.3)

## 2013-03-20 LAB — LDL CHOLESTEROL, DIRECT: Direct LDL: 137.5 mg/dL

## 2013-03-20 LAB — BASIC METABOLIC PANEL
BUN: 13 mg/dL (ref 6–23)
CO2: 22 mEq/L (ref 19–32)
Calcium: 9.1 mg/dL (ref 8.4–10.5)
Chloride: 106 mEq/L (ref 96–112)
Creatinine, Ser: 0.9 mg/dL (ref 0.4–1.2)
GFR: 76.47 mL/min (ref >60.00)
Glucose, Bld: 93 mg/dL (ref 70–99)
Potassium: 3.9 mEq/L (ref 3.5–5.1)
Sodium: 137 mEq/L (ref 135–145)

## 2013-03-20 LAB — CBC WITH DIFFERENTIAL/PLATELET
Basophils Absolute: 0 10*3/uL (ref 0.0–0.1)
Basophils Relative: 0.6 % (ref 0.0–3.0)
Eosinophils Absolute: 0.2 10*3/uL (ref 0.0–0.7)
Eosinophils Relative: 4.7 % (ref 0.0–5.0)
HCT: 41.3 % (ref 36.0–46.0)
Hemoglobin: 14 g/dL (ref 12.0–15.0)
Lymphocytes Relative: 33.1 % (ref 12.0–46.0)
Lymphs Abs: 1.6 10*3/uL (ref 0.7–4.0)
MCHC: 33.9 g/dL (ref 30.0–36.0)
MCV: 88.2 fl (ref 78.0–100.0)
Monocytes Absolute: 0.4 10*3/uL (ref 0.1–1.0)
Monocytes Relative: 8.9 % (ref 3.0–12.0)
Neutro Abs: 2.5 10*3/uL (ref 1.4–7.7)
Neutrophils Relative %: 52.7 % (ref 43.0–77.0)
Platelets: 329 10*3/uL (ref 150.0–400.0)
RBC: 4.68 Mil/uL (ref 3.87–5.11)
RDW: 14.2 % (ref 11.5–14.6)
WBC: 4.8 10*3/uL (ref 4.5–10.5)

## 2013-03-20 LAB — LIPID PANEL
Cholesterol: 204 mg/dL — ABNORMAL HIGH (ref 0–200)
HDL: 45.5 mg/dL (ref >39.00)
Total CHOL/HDL Ratio: 4
Triglycerides: 129 mg/dL (ref 0.0–149.0)
VLDL: 25.8 mg/dL (ref 0.0–40.0)

## 2013-03-20 LAB — HEMOGLOBIN A1C: Hgb A1c MFr Bld: 6.3 % (ref 4.6–6.5)

## 2013-03-20 LAB — TSH: TSH: 2.21 u[IU]/mL (ref 0.35–5.50)

## 2013-03-20 MED ORDER — ALBUTEROL SULFATE HFA 108 (90 BASE) MCG/ACT IN AERS: INHALATION_SPRAY | RESPIRATORY_TRACT | Status: DC

## 2013-03-20 MED ORDER — BECLOMETHASONE DIPROPIONATE 80 MCG/ACT IN AERS: INHALATION_SPRAY | RESPIRATORY_TRACT | Status: DC

## 2013-03-20 NOTE — Progress Notes (Signed)
   Subjective:    Patient ID: Taylor Frye, female    DOB: Jun 01, 1980, 32 y.o.   MRN: [id number]  HPI Pre visit review using our clinic review tool, if applicable. No additional management support is needed unless otherwise documented below in the visit note.   CPE- UTD on GYN.   Review of Systems Patient reports no vision/ hearing changes, adenopathy,fever, weight change,  persistant/recurrent hoarseness , swallowing issues, chest pain, palpitations, edema, persistant/recurrent cough, hemoptysis, dyspnea (rest/exertional/paroxysmal nocturnal), gastrointestinal bleeding (melena, rectal bleeding), abdominal pain, significant heartburn, bowel changes, GU symptoms (dysuria, hematuria, incontinence), Gyn symptoms (abnormal  bleeding, pain),  syncope, focal weakness, memory loss, numbness & tingling, skin/hair/nail changes, abnormal bruising or bleeding, anxiety, or depression.     Objective:   Physical Exam General Appearance:    Alert, cooperative, no distress, appears stated age.  Morbidly obese  Head:    Normocephalic, without obvious abnormality, atraumatic  Eyes:    PERRL, conjunctiva/corneas clear, EOM's intact, fundi    benign, both eyes  Ears:    Normal TM's and external ear canals, both ears  Nose:   Nares normal, septum midline, mucosa normal, no drainage    or sinus tenderness  Throat:   Lips, mucosa, and tongue normal; teeth and gums normal  Neck:   Supple, symmetrical, trachea midline, no adenopathy;    Thyroid: no enlargement/tenderness/nodules  Back:     Symmetric, no curvature, ROM normal, no CVA tenderness  Lungs:     Clear to auscultation bilaterally, respirations unlabored  Chest Wall:    No tenderness or deformity   Heart:    Regular rate and rhythm, S1 and S2 normal, no murmur, rub   or gallop  Breast Exam:    Deferred to GYN  Abdomen:     Soft, non-tender, bowel sounds active all four quadrants,    no masses, no organomegaly  Genitalia:    Deferred to GYN    Rectal:    Extremities:   Extremities normal, atraumatic, no cyanosis or edema  Pulses:   2+ and symmetric all extremities  Skin:   Skin color, texture, turgor normal, no rashes or lesions  Lymph nodes:   Cervical, supraclavicular, and axillary nodes normal  Neurologic:   CNII-XII intact, normal strength, sensation and reflexes    throughout          Assessment & Plan:

## 2013-03-20 NOTE — Assessment & Plan Note (Signed)
Chronic problem for pt.  Pt aware that this is largest risk to her health.  Wants to see nutritionist.  Will refer to multi-disciplinary team at [facility] for medically supervised weight loss program.  Pt expressed understanding and is in agreement w/ plan.

## 2013-03-20 NOTE — Assessment & Plan Note (Signed)
Pt's PE WNL w/ exception of morbid obesity.  Check labs to risk stratify.  Anticipatory guidance provided.

## 2013-03-20 NOTE — Patient Instructions (Signed)
Follow up as needed We'll notify you of your lab results and make any changes if needed We'll call you with the information on the [facility] referral Call with any questions or concerns Happy Holidays!

## 2013-05-03 ENCOUNTER — Other Ambulatory Visit: Payer: Self-pay | Admitting: Family Medicine

## 2013-05-03 NOTE — Telephone Encounter (Signed)
Med filled.  

## 2014-01-13 ENCOUNTER — Emergency Department
Admission: EM | Admit: 2014-01-13 | Discharge: 2014-01-13 | Disposition: A | Discharge status: Home / Self Care | Payer: BC Managed Care – PPO | Source: Home / Self Care | Attending: Emergency Medicine | Admitting: Emergency Medicine

## 2014-01-13 ENCOUNTER — Encounter: Payer: Self-pay | Admitting: Emergency Medicine

## 2014-01-13 DIAGNOSIS — R079 Chest pain, unspecified: Secondary | ICD-10-CM

## 2014-01-13 DIAGNOSIS — I10 Essential (primary) hypertension: Secondary | ICD-10-CM

## 2014-01-13 MED ORDER — HYDROCHLOROTHIAZIDE 25 MG PO TABS: 25.0000 mg | ORAL_TABLET | Freq: Every day | ORAL | Status: DC

## 2014-01-13 NOTE — Discharge Instructions (Signed)
New blood pressure medicine: Hydrochlorothiazide 25 mg, take one daily.-- This prescription was sent to your pharmacy.     Hypertension is another name for high blood pressure. High blood pressure forces your heart to work harder to pump blood. A blood pressure reading has two numbers, which includes a higher number over a lower number (example: 110/72). HOME CARE   Have your blood pressure rechecked by your doctor.  Only take medicine as told by your doctor. Follow the directions carefully. The medicine does not work as well if you skip doses. Skipping doses also puts you at risk for problems.  Do not smoke.  Monitor your blood pressure at home as told by your doctor. GET HELP IF:  You think you are having a reaction to the medicine you are taking.  You have repeat headaches or feel dizzy.  You have puffiness (swelling) in your ankles.  You have trouble with your vision. GET HELP RIGHT AWAY IF:   You get a very bad headache and are confused.  You feel weak, numb, or faint.  You get chest or belly (abdominal) pain.  You throw up (vomit).  You cannot breathe very well. MAKE SURE YOU:   Understand these instructions.  Will watch your condition.  Will get help right away if you are not doing well or get worse.

## 2014-01-13 NOTE — ED Notes (Signed)
Pt c/o elevated blood pressure on and off x 2 months. She reports HA's recently, she is taking ASA for the HA. She c/o episode of dizziness this morning.

## 2014-01-13 NOTE — ED Provider Notes (Signed)
CSN: [id number]     Arrival date & time 01/13/14  1044 History   First MD Initiated Contact with Patient 01/13/14 1133     Chief Complaint  Patient presents with  . Hypertension   (Consider location/radiation/quality/duration/timing/severity/associated sxs/prior Treatment) HPI Pt c/o elevated blood pressure on and off x 2 months. She reports HA's recently, she is taking ASA for the HA. She c/o episode of dizziness this morning.--The dizziness has since resolved. Denies vertigo. Currently denies headache. No acute visual change.  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 Patient was told to see a physician immediately. She was unable to get an appointment with her PCP today, so she presents here to [facility] urgent care. No chest pain or shortness of breath or palpitations . She admits to chronic obesity. She does not get regular exercise. Her asthma is controlled on Qvar. On oral contraceptives, she states her GYN initially found elevated BP but it normalized. Denies any new medication. Denies taking decongestants.  Family history positive for mother and brother having had hypertension and heart disease and dying at an early age. Past Medical History  Diagnosis Date  . Asthma   . Obesity   . Seasonal allergies   . PCOS (polycystic ovarian syndrome)   . Oligomenorrhea   . HSV-2 infection   . Obesity   . Anovulation   . Ovarian cyst   . History of chicken pox   . History of chicken pox    Past Surgical History  Procedure Laterality Date  . Cystectomy      right   Family History  Problem Relation Age of Onset  . Coronary artery disease Mother   . Heart failure Mother   . Hypertension Brother   . Heart failure Brother   . Kidney disease Father    History  Substance Use Topics  . Smoking status: Never Smoker   . Smokeless tobacco: Never Used  . Alcohol Use: Yes     Comment: occ.   OB History    Grav Para Term Preterm Abortions TAB SAB Ect Mult Living       Review of Systems  All other systems reviewed and are negative.   Allergies  Review of patient's allergies indicates no known allergies.  Home Medications   Prior to Admission medications   Medication Sig Start Date End Date Taking? Authorizing Provider  albuterol (PROAIR HFA) 108 (90 BASE) MCG/ACT inhaler INHALE 2 PUFFS EVERY 4 HOURS AS NEEDED 03/20/13  Yes Sheliah Hatch, MD  beclomethasone (QVAR) 80 MCG/ACT inhaler INHALE 1 PUFF INTO THE LUNGS TWICE DAILY 03/20/13  Yes Sheliah Hatch, MD  Levonorgestrel-Ethinyl Estrad (ALESSE, 28, PO) Take by mouth daily.     Yes Historical Provider, MD  hydrochlorothiazide (HYDRODIURIL) 25 MG tablet Take 1 tablet (25 mg total) by mouth daily. 01/13/14   Lajean Manes, MD   BP 175/110  Pulse 83  Temp(Src) 98.5 F (36.9 C) (Oral)  Resp 18  Ht 5' 5.5" (1.664 m)  Wt 425 lb (192.779 kg)  BMI 69.62 kg/m2  SpO2 100% Physical Exam  Nursing note and vitals reviewed. Constitutional: She is oriented to person, place, and time. She appears well-developed and well-nourished. No distress.  Morbidly obese female, pleasant, cooperative, no acute distress  HENT:  Head: Normocephalic and atraumatic.  Mouth/Throat: Oropharynx is clear and moist.  Eyes: Conjunctivae and  EOM are normal. Pupils are equal, round, and reactive to light. No scleral icterus.  Fundoscopic exam:      The right eye shows no hemorrhage and no papilledema.       The left eye shows no hemorrhage and no papilledema.  Neck: Normal range of motion. No JVD present.  Cardiovascular: Normal rate, regular rhythm and normal heart sounds.   No murmur heard. Pulmonary/Chest: Effort normal and breath sounds normal. No respiratory distress. She has no wheezes. She has no rales.  Abdominal: She exhibits no distension. There is no tenderness.  Musculoskeletal: Normal range of motion. She exhibits edema (1+  pretibial edema). She exhibits no tenderness.  No cords or calf tenderness ,Homans sign negative  Neurological: She is alert and oriented to person, place, and time. No cranial nerve deficit.  Skin: Skin is warm. She is not diaphoretic.  Psychiatric: She has a normal mood and affect.    ED Course  Procedures (including critical care time) Labs Review Labs Reviewed - No data to display  Imaging Review No results found.  EKG shows normal sinus rhythm. There is background artifact. Otherwise no acute ischemic abnormalities.--Patient informed MDM   1. Accelerated hypertension   2. Chest pain, unspecified chest pain type    note, not currently having any chest pain.--EKG within normal limits. Morbid obesity. 1+ pretibial edema, likely related to obesity and venous insufficiency, but no evidence of CHF. She would likely benefit from a diuretic.  She needs to be on BP medication. Treatment options discussed, as well as risks, benefits, alternatives. Patient voiced understanding and agreement with the following plans: Begin HCTZ 25 mg daily. #30. No refills. Any further refills or management of hypertension to be by PCP. She voiced understanding. Over 30 minutes spent, greater than 50% of the time spent for counseling about hypertension, need for control, and various other nonpharmacologic measures. She declined any blood work or any other testing today as that is deferred to her PCP. For now, continue on oral contraceptives, but explained that she and her PCP and GYN might need to decide to change to a different OCP if needed for hypertension control. Follow-up with your primary care doctor in 5-7 days, or sooner if symptoms become worse. Precautions discussed. Red flags discussed.--Emergency room if any red flag Questions invited and answered. Patient voiced understanding and agreement.       Lajean Manes, MD 01/13/14 [phone]

## 2014-01-15 ENCOUNTER — Encounter: Payer: Self-pay | Admitting: Family Medicine

## 2014-01-15 ENCOUNTER — Ambulatory Visit (INDEPENDENT_AMBULATORY_CARE_PROVIDER_SITE_OTHER): Payer: BC Managed Care – PPO | Admitting: Family Medicine

## 2014-01-15 VITALS — BP 156/110 | HR 78 | Temp 98.7°F | Resp 17 | Wt >= 6400 oz

## 2014-01-15 DIAGNOSIS — I10 Essential (primary) hypertension: Secondary | ICD-10-CM | POA: Insufficient documentation

## 2014-01-15 DIAGNOSIS — Z6841 Body Mass Index (BMI) 40.0 and over, adult: Secondary | ICD-10-CM

## 2014-01-15 LAB — CBC WITH DIFFERENTIAL/PLATELET
BASOS PCT: 0.4 % (ref 0.0–3.0)
Basophils Absolute: 0 10*3/uL (ref 0.0–0.1)
EOS ABS: 0.2 10*3/uL (ref 0.0–0.7)
Eosinophils Relative: 3.6 % (ref 0.0–5.0)
HCT: 43.2 % (ref 36.0–46.0)
HEMOGLOBIN: 14.6 g/dL (ref 12.0–15.0)
LYMPHS PCT: 41.7 % (ref 12.0–46.0)
Lymphs Abs: 2.1 10*3/uL (ref 0.7–4.0)
MCHC: 33.8 g/dL (ref 30.0–36.0)
MCV: 90.8 fl (ref 78.0–100.0)
MONOS PCT: 11 % (ref 3.0–12.0)
Monocytes Absolute: 0.6 10*3/uL (ref 0.1–1.0)
NEUTROS ABS: 2.2 10*3/uL (ref 1.4–7.7)
NEUTROS PCT: 43.3 % (ref 43.0–77.0)
Platelets: 351 10*3/uL (ref 150.0–400.0)
RBC: 4.76 Mil/uL (ref 3.87–5.11)
RDW: 13.6 % (ref 11.5–15.5)
WBC: 5.1 10*3/uL (ref 4.0–10.5)

## 2014-01-15 LAB — BASIC METABOLIC PANEL
BUN: 11 mg/dL (ref 6–23)
CALCIUM: 9.7 mg/dL (ref 8.4–10.5)
CO2: 28 mEq/L (ref 19–32)
Chloride: 102 mEq/L (ref 96–112)
Creatinine, Ser: 1.1 mg/dL (ref 0.4–1.2)
GFR: 59.12 mL/min — AB (ref >60.00)
GLUCOSE: 102 mg/dL — AB (ref 70–99)
Potassium: 3.8 mEq/L (ref 3.5–5.1)
Sodium: 136 mEq/L (ref 135–145)

## 2014-01-15 LAB — HEPATIC FUNCTION PANEL
ALBUMIN: 3.7 g/dL (ref 3.5–5.2)
ALT: 20 U/L (ref 0–35)
AST: 21 U/L (ref 0–37)
Alkaline Phosphatase: 53 U/L (ref 39–117)
Bilirubin, Direct: 0 mg/dL (ref 0.0–0.3)
TOTAL PROTEIN: 8.7 g/dL — AB (ref 6.0–8.3)
Total Bilirubin: 0.4 mg/dL (ref 0.2–1.2)

## 2014-01-15 LAB — TSH: TSH: 0.58 u[IU]/mL (ref 0.35–4.50)

## 2014-01-15 MED ORDER — LOSARTAN POTASSIUM-HCTZ 50-12.5 MG PO TABS: 1.0000 | ORAL_TABLET | Freq: Every day | ORAL | Status: DC

## 2014-01-15 NOTE — Patient Instructions (Signed)
Follow up in 3-4 weeks to recheck BP We'll notify you of your lab results and make any changes if needed We'll call you with your nutrition about STOP the HCTZ START the new medication once daily Drink plenty of fluids Limit your salt/sodium intake Call with any questions or concerns Hang in there!!!

## 2014-01-15 NOTE — Progress Notes (Signed)
Pre visit review using our clinic review tool, if applicable. No additional management support is needed unless otherwise documented below in the visit note. 

## 2014-01-15 NOTE — Progress Notes (Signed)
   Subjective:    Patient ID: Taylor Frye, female    DOB: 18-Apr-1981, 33 y.o.   MRN: [id number]  HPI HTN- new.  Pt went to UC on 9/28 w/ elevated BP, HA.  Was started on HCTZ.  No labs done.  Stopped the pills Monday (GYN- [facility]).  No personal hx of elevated BP.  + family hx.  Weight has been constant x18 months.  No new meds.  Pt reports some stress but not excessive.  No CP.  No visual changes.  No SOB above baseline.  Morbid obesity- ongoing issue for pt.  Unable to afford bariatric clinic.  Wants to lose weight- asking for local nutritional referral   Review of Systems For ROS see HPI     Objective:   Physical Exam  Vitals reviewed. Constitutional: She is oriented to person, place, and time. She appears well-developed and well-nourished. No distress.  Morbidly obese  HENT:  Head: Normocephalic and atraumatic.  Eyes: Conjunctivae and EOM are normal. Pupils are equal, round, and reactive to light.  Neck: Normal range of motion. Neck supple. No thyromegaly present.  Cardiovascular: Normal rate, regular rhythm, normal heart sounds and intact distal pulses.   No murmur heard. Pulmonary/Chest: Effort normal and breath sounds normal. No respiratory distress.  Abdominal: Soft. She exhibits no distension. There is no tenderness.  Musculoskeletal: She exhibits no edema.  Lymphadenopathy:    She has no cervical adenopathy.  Neurological: She is alert and oriented to person, place, and time.  Skin: Skin is warm and dry.  Psychiatric: She has a normal mood and affect. Her behavior is normal.          Assessment & Plan:

## 2014-01-15 NOTE — Assessment & Plan Note (Signed)
Chronic problem.  Pt [facility] to afford [facility].  Refer to local nutrition.  Will follow.

## 2014-01-15 NOTE — Assessment & Plan Note (Signed)
New.  Pt w/ no hx of elevated BP and suddenly has rapid rise w/ associated HAs.  Suspect HAs are due to elevated BP and not the cause.  Check labs.  Start meds.  Reviewed dietary and lifestyle changes.  Will follow closely.

## 2014-02-12 ENCOUNTER — Ambulatory Visit (INDEPENDENT_AMBULATORY_CARE_PROVIDER_SITE_OTHER): Payer: BC Managed Care – PPO | Admitting: Family Medicine

## 2014-02-12 ENCOUNTER — Encounter: Payer: Self-pay | Admitting: Family Medicine

## 2014-02-12 VITALS — BP 142/90 | HR 73 | Temp 98.1°F | Resp 16 | Ht 66.5 in | Wt >= 6400 oz

## 2014-02-12 DIAGNOSIS — I10 Essential (primary) hypertension: Secondary | ICD-10-CM

## 2014-02-12 DIAGNOSIS — Z6841 Body Mass Index (BMI) 40.0 and over, adult: Secondary | ICD-10-CM

## 2014-02-12 DIAGNOSIS — N912 Amenorrhea, unspecified: Secondary | ICD-10-CM

## 2014-02-12 LAB — BASIC METABOLIC PANEL
BUN: 16 mg/dL (ref 6–23)
CHLORIDE: 105 meq/L (ref 96–112)
CO2: 20 meq/L (ref 19–32)
Calcium: 9.3 mg/dL (ref 8.4–10.5)
Creatinine, Ser: 0.9 mg/dL (ref 0.4–1.2)
GFR: 76.05 mL/min (ref >60.00)
Glucose, Bld: 92 mg/dL (ref 70–99)
POTASSIUM: 3.9 meq/L (ref 3.5–5.1)
Sodium: 139 mEq/L (ref 135–145)

## 2014-02-12 LAB — HCG, QUANTITATIVE, PREGNANCY: QUANTITATIVE HCG: 0.2 m[IU]/mL

## 2014-02-12 MED ORDER — LOSARTAN POTASSIUM-HCTZ 100-12.5 MG PO TABS: 1.0000 | ORAL_TABLET | Freq: Every day | ORAL | Status: DC

## 2014-02-12 NOTE — Progress Notes (Signed)
Pre visit review using our clinic review tool, if applicable. No additional management support is needed unless otherwise documented below in the visit note. 

## 2014-02-12 NOTE — Addendum Note (Signed)
Addended by: Silvio PateHOMPSON, Jona Zappone D on: 02/12/2014 09:54 AM   Modules accepted: Orders

## 2014-02-12 NOTE — Assessment & Plan Note (Signed)
Chronic problem.  Still not well controlled.  Increase Losartan to 100mg  while keeping the HCTZ at 12.5mg .  Check BMP due to addition of ARB.

## 2014-02-12 NOTE — Assessment & Plan Note (Signed)
Continues to deteriorate.  Pt has gained another 10 lbs.  Stressed need for healthy food choices and regular exercise.  Pt has appt w/ Nutrition next month.  Will continue to follow closely.

## 2014-02-12 NOTE — Patient Instructions (Signed)
Follow up in 4-6 weeks to recheck BP Please try and make healthy food choices and start exercising regularly Start the new combo pill- Losartan-HCTZ 100/12.5mg  Call with any questions or concerns Hang in there!!!

## 2014-02-12 NOTE — Progress Notes (Signed)
   Subjective:    Patient ID: Taylor Frye, female    DOB: 1980-12-01, 33 y.o.   MRN: [id number]  HPI HTN- chronic problem.  Started on Losartan HCTZ combo last visit due to elevated BP.  BP remains elevated- 'i don't have HAs no more'.  No CP, SOB, HAs, visual changes, edema.  Obesity- pt has gained another 10 lbs.  Pt has appt coming up next month.  Pt is trying to first eliminate salt and then her plan is to stop eating out daily.    Amenorrhea- pt would like a pregnancy test   Review of Systems For ROS see HPI     Objective:   Physical Exam  Vitals reviewed. Constitutional: She is oriented to person, place, and time. She appears well-developed and well-nourished. No distress.  obese  HENT:  Head: Normocephalic and atraumatic.  Eyes: Conjunctivae and EOM are normal. Pupils are equal, round, and reactive to light.  Neck: Normal range of motion. Neck supple. No thyromegaly present.  Cardiovascular: Normal rate, regular rhythm, normal heart sounds and intact distal pulses.   No murmur heard. Pulmonary/Chest: Effort normal and breath sounds normal. No respiratory distress.  Abdominal: Soft. She exhibits no distension. There is no tenderness.  Musculoskeletal: She exhibits no edema.  Lymphadenopathy:    She has no cervical adenopathy.  Neurological: She is alert and oriented to person, place, and time.  Skin: Skin is warm and dry.  Psychiatric: She has a normal mood and affect. Her behavior is normal.          Assessment & Plan:

## 2014-02-12 NOTE — Assessment & Plan Note (Signed)
Suspect this is due to pt's PCOS and anovulation but due to use of ARB will r/o pregnancy.

## 2014-02-28 ENCOUNTER — Encounter: Payer: BC Managed Care – PPO | Attending: Family Medicine | Admitting: Dietician

## 2014-02-28 ENCOUNTER — Encounter: Payer: Self-pay | Admitting: Dietician

## 2014-02-28 VITALS — Ht 65.0 in | Wt >= 6400 oz

## 2014-02-28 DIAGNOSIS — Z713 Dietary counseling and surveillance: Secondary | ICD-10-CM | POA: Diagnosis not present

## 2014-02-28 DIAGNOSIS — I10 Essential (primary) hypertension: Secondary | ICD-10-CM | POA: Diagnosis not present

## 2014-02-28 DIAGNOSIS — Z6841 Body Mass Index (BMI) 40.0 and over, adult: Secondary | ICD-10-CM | POA: Diagnosis not present

## 2014-02-28 NOTE — Progress Notes (Signed)
Medical Nutrition Therapy:  Appt start time: 0800 end time:  0900.   Assessment:  Primary concerns today: Taylor Frye was referred today for weight management. Hx of hypertension. 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   She is single, lives with a roommate and reports eating about half of her meals out to fast-food.  Her roommate will shop and cook for their household.  She eats in the living room while looking on her phone.  She used to walk in the past and had lost down to 400 lbs when she was walking regularly.  She is tempted to snack more at work since snacks in the break room are convenient there to her desk.   She states she is motivated to change since her family members have passed away from heart disease, but challenged by her schedule and current habits. She does best making changes when she has a Radio broadcast assistantcoworker or friend making changes with her.  I assess her eating choices are easily swayed by her friends.  She states she wants healthy food ideas and help with portion control.  Learning Readiness:   Contemplating  Ready  MEDICATIONS: see list   DIETARY INTAKE:  Usual eating pattern includes 3 meals and 1-2 snacks per day.  24-hr recall:  B ( AM): two packages of cheese grits, decaf coffee with "a lot of cream and sugar"  Snk (10 AM): almonds or kettle cooked chips, cherry coke zero  L (12:30 PM): leftovers from dinner or mozerralla sticks from [employer] Snk (3 PM): not usually, leftover halloween candy D ( PM): [place] restaurant or fast food, water or diet soda Snk ( PM): none Beverages: water, diet soda, coffee, mocha drink from [employer] weekly, beer socially, occasionally reg soda  Usual physical activity: none  Progress Towards Goal(s):  Not yet started   Nutritional Diagnosis:  NI-1.5 Excessive energy intake As related to frequent consumption of foods outside of the home and limited physical activity.  As evidenced by dietary recall, patient report, and BMI of 71.     Intervention:  Nutrition counseling and education. Provided information on heart healthy food choices and low-sodium tips.  Used motivational interviewing to enhance change talk and assess readiness to change.  Patient is contemplating making changes to exercise and types of foods to eat.  I assess her to be preparing to make changes to her portions and reducing mindless eating.  Discussed finding healthier options of foods when out to eat.  Stated that it can be difficult to find foods appropriate for low-sodium, low-fat diets when out to eat and recommend she consider cooking at home more often.  Patient states she is moving into her own place soon and plans to cook more when she has her own kitchen.  Provided information on healthy snack choices and recommended she choose snacks with balanced carbohydrate and protein (provided examples).  Main focus today was on her request for help with portion sizes.  We discussed strategies to help her manage her portions and together we set the following goals. Patient stated she was very confident in being able to achieve these goals.   Goals: Practice mindful eating (listening to hunger and fullness cues), especially at work.   Identify places, times, or people you are with when you find yourself eating without hunger. Drink a glass of water before you begin eating each meal. Use smaller plates, bowls, glasses and serving spoons. Get up from your desk every hour for 5 minutes at a time  to walk around and stretch. Consider getting a step counter to reach 10,000 steps a day.  Teaching Method Utilized:  Visual Auditory  Handouts given during visit include:  Hypertension  Nutritional Strategies for Weight Loss  Heart Healthy Foods  Low Sodium Flavoring Options  Barriers to learning/adherence to lifestyle change: none  Demonstrated degree of understanding via:  Teach Back   Monitoring/Evaluation:  Dietary intake, exercise, and body weight in 2  month(s).

## 2014-02-28 NOTE — Patient Instructions (Signed)
Practice mindful eating, especially at work.   Identify places, times, or people you are with when you find yourself eating without hunger. Drink a glass of water before you begin eating the meal. Use smaller plates, bowls, glasses and serving spoons. Get up from your desk every hour for 5 minutes at a time to walk around and stretch. Consider getting a step counter to reach 10,000 steps a day.

## 2014-03-26 ENCOUNTER — Emergency Department (HOSPITAL_COMMUNITY)
Admission: EM | Admit: 2014-03-26 | Discharge: 2014-03-26 | Disposition: A | Discharge status: Home / Self Care | Payer: BC Managed Care – PPO | Attending: Emergency Medicine | Admitting: Emergency Medicine

## 2014-03-26 ENCOUNTER — Encounter (HOSPITAL_COMMUNITY): Payer: Self-pay

## 2014-03-26 DIAGNOSIS — Z8742 Personal history of other diseases of the female genital tract: Secondary | ICD-10-CM | POA: Diagnosis not present

## 2014-03-26 DIAGNOSIS — E669 Obesity, unspecified: Secondary | ICD-10-CM | POA: Diagnosis not present

## 2014-03-26 DIAGNOSIS — Z79899 Other long term (current) drug therapy: Secondary | ICD-10-CM | POA: Diagnosis not present

## 2014-03-26 DIAGNOSIS — J45909 Unspecified asthma, uncomplicated: Secondary | ICD-10-CM | POA: Diagnosis not present

## 2014-03-26 DIAGNOSIS — Z7951 Long term (current) use of inhaled steroids: Secondary | ICD-10-CM | POA: Diagnosis not present

## 2014-03-26 DIAGNOSIS — J029 Acute pharyngitis, unspecified: Secondary | ICD-10-CM | POA: Diagnosis not present

## 2014-03-26 DIAGNOSIS — Z8619 Personal history of other infectious and parasitic diseases: Secondary | ICD-10-CM | POA: Diagnosis not present

## 2014-03-26 DIAGNOSIS — Z793 Long term (current) use of hormonal contraceptives: Secondary | ICD-10-CM | POA: Insufficient documentation

## 2014-03-26 DIAGNOSIS — I10 Essential (primary) hypertension: Secondary | ICD-10-CM | POA: Diagnosis not present

## 2014-03-26 MED ORDER — IBUPROFEN 800 MG PO TABS
800.0000 mg | ORAL_TABLET | Freq: Once | ORAL | Status: AC
  Administered 2014-03-26: 800 mg via ORAL
  Filled 2014-03-26: qty 1

## 2014-03-26 MED ORDER — MAGIC MOUTHWASH
5.0000 mL | Freq: Once | ORAL | Status: AC
  Administered 2014-03-26: 5 mL via ORAL
  Filled 2014-03-26: qty 5

## 2014-03-26 MED ORDER — IBUPROFEN 800 MG PO TABS: 800.0000 mg | ORAL_TABLET | Freq: Three times a day (TID) | ORAL | Status: DC | PRN

## 2014-03-26 NOTE — Discharge Instructions (Signed)
Pharyngitis Ms. Taylor Frye, you were seen today for a sore throat. You likely have a viral infection. Take Motrin at home as needed for pain and follow-up with your primary care physician within 3 days for continued treatment. Indeed to stay well-hydrated. If any of your symptoms worsen come back to the emergency department immediately. Thank you.  Pharyngitis is a sore throat (pharynx). There is redness, pain, and swelling of your throat. HOME CARE   Drink enough fluids to keep your pee (urine) clear or pale yellow.  Only take medicine as told by your doctor.  You may get sick again if you do not take medicine as told. Finish your medicines, even if you start to feel better.  Do not take aspirin.  Rest.  Rinse your mouth (gargle) with salt water ( tsp of salt per 1 qt of water) every 1-2 hours. This will help the pain.  If you are not at risk for choking, you can suck on hard candy or sore throat lozenges. GET HELP IF:  You have large, tender lumps on your neck.  You have a rash.  You cough up green, yellow-brown, or bloody spit. GET HELP RIGHT AWAY IF:   You have a stiff neck.  You drool or cannot swallow liquids.  You throw up (vomit) or are not able to keep medicine or liquids down.  You have very bad pain that does not go away with medicine.  You have problems breathing (not from a stuffy nose). MAKE SURE YOU:   Understand these instructions.  Will watch your condition.  Will get help right away if you are not doing well or get worse. Document Released: 09/21/2007 Document Revised: 01/23/2013 Document Reviewed: 12/10/2012 [facility] Patient Information 2015 [employer] [place]. This information is not intended to replace advice given to you by your health care provider. Make sure you discuss any questions you have with your health care provider.

## 2014-03-26 NOTE — ED Provider Notes (Signed)
CSN: [id number]     Arrival date & time 03/26/14  0425 History   First MD Initiated Contact with Patient 03/26/14 0502     Chief Complaint  Patient presents with  . Sore Throat     (Consider location/radiation/quality/duration/timing/severity/associated sxs/prior Treatment) HPI Taylor Frye is a 33 y.o. female with past medical history of asthma, seasonal allergies, obesity coming in with a sore throat. Patient states this has occurred off and on for the past 1-2 weeks. She's had sick contacts and her goddaughter with similar symptoms. She describes dysphasia, but no pain while at rest. She's also had rhinorrhea, congestion, bilateral ear pressure. She denies any fevers, coughing.  Patient does not states she has any back pain to meet despite the triage note. She has been taking over-the-counter medications without any relief. She has no further complaints.  10 Systems reviewed and are negative for acute change except as noted in the HPI.     Past Medical History  Diagnosis Date  . Asthma   . Obesity   . Seasonal allergies   . PCOS (polycystic ovarian syndrome)   . Oligomenorrhea   . HSV-2 infection   . Obesity   . Anovulation   . Ovarian cyst   . History of chicken pox   . History of chicken pox   . Hypertension    Past Surgical History  Procedure Laterality Date  . Cystectomy      right   Family History  Problem Relation Age of Onset  . Coronary artery disease Mother   . Heart failure Mother   . Hypertension Brother   . Heart failure Brother   . Kidney disease Father    History  Substance Use Topics  . Smoking status: Never Smoker   . Smokeless tobacco: Never Used  . Alcohol Use: Yes     Comment: occ.   OB History    Gravida Para Term Preterm AB TAB SAB Ectopic Multiple Living   0 0 0 0 0 0 0 0 0 0      Review of Systems    Allergies  Review of patient's allergies indicates no known allergies.  Home Medications   Prior to Admission medications    Medication Sig Start Date End Date Taking? Authorizing Provider  albuterol (PROAIR HFA) 108 (90 BASE) MCG/ACT inhaler INHALE 2 PUFFS EVERY 4 HOURS AS NEEDED Patient taking differently: Inhale 1 puff into the lungs every 4 (four) hours as needed for wheezing. INHALE 2 PUFFS EVERY 4 HOURS AS NEEDED 03/20/13  Yes Sheliah HatchKatherine E Tabori, MD  beclomethasone (QVAR) 80 MCG/ACT inhaler INHALE 1 PUFF INTO THE LUNGS TWICE DAILY 03/20/13  Yes Sheliah HatchKatherine E Tabori, MD  Levonorgestrel-Ethinyl Estrad (ALESSE, 28, PO) Take by mouth daily.     Yes Historical Provider, MD  losartan-hydrochlorothiazide (HYZAAR) 100-12.5 MG per tablet Take 1 tablet by mouth daily. 02/12/14  Yes Sheliah HatchKatherine E Tabori, MD  montelukast (SINGULAIR) 10 MG tablet Take 10 mg by mouth at bedtime.   Yes Historical Provider, MD  ibuprofen (ADVIL,MOTRIN) 800 MG tablet Take 1 tablet (800 mg total) by mouth every 8 (eight) hours as needed for moderate pain. 03/26/14   Tomasita CrumbleAdeleke Aika Brzoska, MD   BP 114/57 mmHg  Pulse 88  Temp(Src) 98 F (36.7 C) (Oral)  Resp 20  Ht 5\' 5"  (1.651 m)  Wt 420 lb (190.511 kg)  BMI 69.89 kg/m2  SpO2 100%  LMP 01/24/2014 Physical Exam  Constitutional: She is oriented to person, place, and time. She appears well-developed  and well-nourished. No distress.  Obese female  HENT:  Head: Normocephalic and atraumatic.  Nose: Nose normal.  Mouth/Throat: Oropharynx is clear and moist. No oropharyngeal exudate.  Right TM is dull, left TM is normal.  Eyes: Conjunctivae and EOM are normal. Pupils are equal, round, and reactive to light. No scleral icterus.  Neck: Normal range of motion. Neck supple. No JVD present. No tracheal deviation present. No thyromegaly present.  Cardiovascular: Normal rate, regular rhythm and normal heart sounds.  Exam reveals no gallop and no friction rub.   No murmur heard. Pulmonary/Chest: Effort normal and breath sounds normal. No respiratory distress. She has no wheezes. She exhibits no tenderness.   Abdominal: Soft. Bowel sounds are normal. She exhibits no distension and no mass. There is no tenderness. There is no rebound and no guarding.  Musculoskeletal: Normal range of motion. She exhibits no edema or tenderness.  Lymphadenopathy:    She has no cervical adenopathy.  Neurological: She is alert and oriented to person, place, and time.  Skin: Skin is warm and dry. No rash noted. She is not diaphoretic. No erythema. No pallor.  Nursing note and vitals reviewed.   ED Course  Procedures (including critical care time) Labs Review Labs Reviewed  RAPID STREP SCREEN    Imaging Review No results found.   EKG Interpretation None      MDM   Final diagnoses:  Viral pharyngitis    Patient since emergency department for sore throat. This is likely a viral pharyngitis. Going on for greater than 1 week, her physical exam is unremarkable. Patient was given symptomatic treatment as well as education on relief at home. Antibiotics are not indicated at this time. She is advised to follow-up with her primary care physician for continued treatment. Her vital signs remain within her normal limits and she is safe for discharge.    Tomasita CrumbleAdeleke Aragorn Recker, MD 03/26/14 [id number]

## 2014-03-26 NOTE — ED Notes (Signed)
Pt complains of a sore throat all week, this am she woke up with a pain in her back.

## 2014-04-05 ENCOUNTER — Other Ambulatory Visit: Payer: Self-pay | Admitting: Family Medicine

## 2014-04-21 ENCOUNTER — Telehealth: Payer: Self-pay | Admitting: *Deleted

## 2014-04-21 ENCOUNTER — Ambulatory Visit: Payer: BC Managed Care – PPO | Admitting: Family Medicine

## 2014-04-21 NOTE — Telephone Encounter (Signed)
Pt did not show for appointment 04/21/14 at 10:30am for "6 wks/bp check"

## 2014-04-21 NOTE — Telephone Encounter (Signed)
Pt needs no-show fee 

## 2014-04-21 NOTE — Telephone Encounter (Signed)
See note below

## 2014-05-02 ENCOUNTER — Ambulatory Visit: Payer: BC Managed Care – PPO | Admitting: Dietician

## 2014-05-24 ENCOUNTER — Other Ambulatory Visit: Payer: Self-pay | Admitting: Family Medicine

## 2014-05-26 NOTE — Telephone Encounter (Signed)
Last filled:  03/20/13 Amt: 8.7 g, 6 refills Last OV:  02/12/14  Med filled x 3 mths.

## 2014-06-21 ENCOUNTER — Other Ambulatory Visit: Payer: Self-pay | Admitting: Family Medicine

## 2014-06-23 ENCOUNTER — Encounter: Payer: Self-pay | Admitting: General Practice

## 2014-06-23 NOTE — Telephone Encounter (Signed)
Ok for #30, no refills w/o appt to recheck BP and labs

## 2014-06-23 NOTE — Telephone Encounter (Signed)
Letter mailed

## 2014-06-23 NOTE — Telephone Encounter (Signed)
Pt last seen 01/2014. Pt advised to follow up in 4-6 weeks. No upcoming appts, please advise?

## 2014-07-03 ENCOUNTER — Other Ambulatory Visit: Payer: Self-pay | Admitting: Family Medicine

## 2014-07-03 NOTE — Telephone Encounter (Signed)
Med filled.  

## 2014-07-23 ENCOUNTER — Ambulatory Visit: Payer: Self-pay | Admitting: Family Medicine

## 2014-07-30 ENCOUNTER — Ambulatory Visit (INDEPENDENT_AMBULATORY_CARE_PROVIDER_SITE_OTHER): Payer: BLUE CROSS/BLUE SHIELD | Admitting: Family Medicine

## 2014-07-30 ENCOUNTER — Encounter: Payer: Self-pay | Admitting: Family Medicine

## 2014-07-30 VITALS — BP 120/72 | HR 80 | Temp 98.3°F | Resp 16 | Wt >= 6400 oz

## 2014-07-30 DIAGNOSIS — I10 Essential (primary) hypertension: Secondary | ICD-10-CM | POA: Diagnosis not present

## 2014-07-30 LAB — CBC WITH DIFFERENTIAL/PLATELET
BASOS PCT: 0.6 % (ref 0.0–3.0)
Basophils Absolute: 0 10*3/uL (ref 0.0–0.1)
EOS ABS: 0.1 10*3/uL (ref 0.0–0.7)
Eosinophils Relative: 2.7 % (ref 0.0–5.0)
HCT: 42.6 % (ref 36.0–46.0)
HEMOGLOBIN: 14.7 g/dL (ref 12.0–15.0)
Lymphocytes Relative: 41.2 % (ref 12.0–46.0)
Lymphs Abs: 2.1 10*3/uL (ref 0.7–4.0)
MCHC: 34.4 g/dL (ref 30.0–36.0)
MCV: 89.3 fl (ref 78.0–100.0)
MONO ABS: 0.6 10*3/uL (ref 0.1–1.0)
Monocytes Relative: 11.3 % (ref 3.0–12.0)
NEUTROS ABS: 2.3 10*3/uL (ref 1.4–7.7)
Neutrophils Relative %: 44.2 % (ref 43.0–77.0)
Platelets: 335 10*3/uL (ref 150.0–400.0)
RBC: 4.78 Mil/uL (ref 3.87–5.11)
RDW: 13.7 % (ref 11.5–15.5)
WBC: 5.2 10*3/uL (ref 4.0–10.5)

## 2014-07-30 LAB — BASIC METABOLIC PANEL
BUN: 13 mg/dL (ref 6–23)
CO2: 26 mEq/L (ref 19–32)
CREATININE: 0.94 mg/dL (ref 0.40–1.20)
Calcium: 10 mg/dL (ref 8.4–10.5)
Chloride: 101 mEq/L (ref 96–112)
GFR: 87.28 mL/min (ref >60.00)
Glucose, Bld: 98 mg/dL (ref 70–99)
Potassium: 3.9 mEq/L (ref 3.5–5.1)
Sodium: 135 mEq/L (ref 135–145)

## 2014-07-30 NOTE — Progress Notes (Signed)
Pre visit review using our clinic review tool, if applicable. No additional management support is needed unless otherwise documented below in the visit note. 

## 2014-07-30 NOTE — Progress Notes (Signed)
   Subjective:    Patient ID: Taylor Frye, female    DOB: [birth year], 34 y.o.   MRN: [id number]  HPI HTN- chronic problem, on Losartan HCTZ daily.  BP well controlled.  Pt has lost 10 lbs since Oct.  'i'm feeling good'.  Pt is doing a low carb diet, has joined [employer].  No CP, SOB, HAs, visual changes, edema.   Review of Systems For ROS see HPI     Objective:   Physical Exam  Constitutional: She is oriented to person, place, and time. She appears well-developed and well-nourished. No distress.  obese  HENT:  Head: Normocephalic and atraumatic.  Eyes: Conjunctivae and EOM are normal. Pupils are equal, round, and reactive to light.  Neck: Normal range of motion. Neck supple. No thyromegaly present.  Cardiovascular: Normal rate, regular rhythm, normal heart sounds and intact distal pulses.   No murmur heard. Pulmonary/Chest: Effort normal and breath sounds normal. No respiratory distress.  Abdominal: Soft. She exhibits no distension. There is no tenderness.  Musculoskeletal: She exhibits no edema.  Lymphadenopathy:    She has no cervical adenopathy.  Neurological: She is alert and oriented to person, place, and time.  Skin: Skin is warm and dry.  Psychiatric: She has a normal mood and affect. Her behavior is normal.  Vitals reviewed.         Assessment & Plan:

## 2014-07-30 NOTE — Patient Instructions (Signed)
Schedule your complete physical in 6 months We'll notify you of your lab results and make any changes if needed Keep up the good work on healthy diet and regular exercise- you're doing great!! Call with any questions or concerns Happy Spring!!! 

## 2014-07-30 NOTE — Assessment & Plan Note (Signed)
Chronic problem, well controlled.  Asymptomatic.  Applauded her recent weight loss efforts and healthy choices.  Check BMP.  No anticipated med changes.  Will continue to follow closely.

## 2014-12-19 ENCOUNTER — Other Ambulatory Visit: Payer: Self-pay | Admitting: Family Medicine

## 2014-12-19 NOTE — Telephone Encounter (Signed)
Verbal approval from Dr. Tabori. 

## 2014-12-23 NOTE — Telephone Encounter (Signed)
Medication filled to pharmacy as requested.   

## 2015-01-24 ENCOUNTER — Other Ambulatory Visit: Payer: Self-pay | Admitting: Family Medicine

## 2015-01-29 ENCOUNTER — Encounter: Payer: BLUE CROSS/BLUE SHIELD | Admitting: Family Medicine

## 2015-02-17 LAB — HM PAP SMEAR

## 2015-02-28 ENCOUNTER — Other Ambulatory Visit: Payer: Self-pay | Admitting: Family Medicine

## 2015-03-02 NOTE — Telephone Encounter (Signed)
Medication filled to pharmacy as requested.   

## 2015-04-16 ENCOUNTER — Other Ambulatory Visit: Payer: Self-pay | Admitting: Family Medicine

## 2015-04-17 NOTE — Telephone Encounter (Signed)
Medication filled to pharmacy as requested.   

## 2015-05-09 ENCOUNTER — Other Ambulatory Visit: Payer: Self-pay | Admitting: Family Medicine

## 2015-05-11 NOTE — Telephone Encounter (Signed)
Medication filled to pharmacy as requested.   

## 2015-05-25 ENCOUNTER — Other Ambulatory Visit: Payer: Self-pay | Admitting: Family Medicine

## 2015-05-25 NOTE — Telephone Encounter (Signed)
Last OV 07/30/14, pt advised to schedule CPE in 6 months. Next CPE scheduled for 07/2015. Ok to fill singulair

## 2015-06-18 ENCOUNTER — Other Ambulatory Visit: Payer: Self-pay | Admitting: Family Medicine

## 2015-06-18 NOTE — Telephone Encounter (Signed)
Medication filled to pharmacy as requested.   

## 2015-06-19 ENCOUNTER — Encounter: Payer: BLUE CROSS/BLUE SHIELD | Admitting: Family Medicine

## 2015-06-24 ENCOUNTER — Telehealth: Payer: Self-pay | Admitting: Family Medicine

## 2015-06-24 MED ORDER — BECLOMETHASONE DIPROPIONATE 80 MCG/ACT IN AERS: INHALATION_SPRAY | RESPIRATORY_TRACT | Status: DC

## 2015-06-24 NOTE — Telephone Encounter (Signed)
Medication filled to pharmacy as requested.   

## 2015-06-24 NOTE — Telephone Encounter (Signed)
Caller name:Self  Can be reached: [phone] Pharmacy:  [id number] - [place], [place] - [address] [facility] [phone] (Phone) [phone] (Fax)        Reason for call: Refill QVAR 80 MCG/ACT inhaler [[id number]

## 2015-07-15 ENCOUNTER — Ambulatory Visit (INDEPENDENT_AMBULATORY_CARE_PROVIDER_SITE_OTHER): Payer: BLUE CROSS/BLUE SHIELD | Admitting: Family Medicine

## 2015-07-15 ENCOUNTER — Encounter: Payer: Self-pay | Admitting: Family Medicine

## 2015-07-15 VITALS — BP 121/72 | HR 78 | Temp 98.0°F | Resp 17 | Ht 65.0 in | Wt 398.1 lb

## 2015-07-15 DIAGNOSIS — Z6841 Body Mass Index (BMI) 40.0 and over, adult: Secondary | ICD-10-CM | POA: Diagnosis not present

## 2015-07-15 DIAGNOSIS — Z0000 Encounter for general adult medical examination without abnormal findings: Secondary | ICD-10-CM | POA: Diagnosis not present

## 2015-07-15 LAB — LIPID PANEL
CHOL/HDL RATIO: 6
Cholesterol: 180 mg/dL (ref 0–200)
HDL: 31.7 mg/dL — AB (ref >39.00)
LDL Cholesterol: 117 mg/dL — ABNORMAL HIGH (ref 0–99)
NONHDL: 148.46
Triglycerides: 158 mg/dL — ABNORMAL HIGH (ref 0.0–149.0)
VLDL: 31.6 mg/dL (ref 0.0–40.0)

## 2015-07-15 LAB — CBC WITH DIFFERENTIAL/PLATELET
Basophils Absolute: 0 10*3/uL (ref 0.0–0.1)
Basophils Relative: 0.4 % (ref 0.0–3.0)
Eosinophils Absolute: 0.3 10*3/uL (ref 0.0–0.7)
Eosinophils Relative: 6.2 % — ABNORMAL HIGH (ref 0.0–5.0)
HCT: 40.3 % (ref 36.0–46.0)
HEMOGLOBIN: 13.4 g/dL (ref 12.0–15.0)
Lymphocytes Relative: 37 % (ref 12.0–46.0)
Lymphs Abs: 1.7 10*3/uL (ref 0.7–4.0)
MCHC: 33.2 g/dL (ref 30.0–36.0)
MCV: 91.7 fl (ref 78.0–100.0)
MONOS PCT: 9.5 % (ref 3.0–12.0)
Monocytes Absolute: 0.4 10*3/uL (ref 0.1–1.0)
Neutro Abs: 2.1 10*3/uL (ref 1.4–7.7)
Neutrophils Relative %: 46.9 % (ref 43.0–77.0)
Platelets: 305 10*3/uL (ref 150.0–400.0)
RBC: 4.39 Mil/uL (ref 3.87–5.11)
RDW: 13.8 % (ref 11.5–15.5)
WBC: 4.6 10*3/uL (ref 4.0–10.5)

## 2015-07-15 LAB — HEPATIC FUNCTION PANEL
ALT: 20 U/L (ref 0–35)
AST: 20 U/L (ref 0–37)
Albumin: 3.7 g/dL (ref 3.5–5.2)
Alkaline Phosphatase: 51 U/L (ref 39–117)
BILIRUBIN TOTAL: 0.3 mg/dL (ref 0.2–1.2)
Bilirubin, Direct: 0 mg/dL (ref 0.0–0.3)
Total Protein: 7.8 g/dL (ref 6.0–8.3)

## 2015-07-15 LAB — BASIC METABOLIC PANEL
BUN: 16 mg/dL (ref 6–23)
CALCIUM: 9.7 mg/dL (ref 8.4–10.5)
CHLORIDE: 105 meq/L (ref 96–112)
CO2: 28 mEq/L (ref 19–32)
CREATININE: 0.79 mg/dL (ref 0.40–1.20)
GFR: 106.09 mL/min (ref >60.00)
Glucose, Bld: 93 mg/dL (ref 70–99)
Potassium: 3.6 mEq/L (ref 3.5–5.1)
Sodium: 138 mEq/L (ref 135–145)

## 2015-07-15 LAB — TSH: TSH: 2.18 u[IU]/mL (ref 0.35–4.50)

## 2015-07-15 LAB — VITAMIN D 25 HYDROXY (VIT D DEFICIENCY, FRACTURES): VITD: 15.81 ng/mL — ABNORMAL LOW (ref 30.00–100.00)

## 2015-07-15 LAB — HEMOGLOBIN A1C: HEMOGLOBIN A1C: 6.1 % (ref 4.6–6.5)

## 2015-07-15 MED ORDER — MONTELUKAST SODIUM 10 MG PO TABS: 10.0000 mg | ORAL_TABLET | Freq: Every day | ORAL | Status: DC

## 2015-07-15 MED ORDER — BECLOMETHASONE DIPROPIONATE 80 MCG/ACT IN AERS: INHALATION_SPRAY | RESPIRATORY_TRACT | Status: DC

## 2015-07-15 MED ORDER — ALBUTEROL SULFATE HFA 108 (90 BASE) MCG/ACT IN AERS: INHALATION_SPRAY | RESPIRATORY_TRACT | Status: DC

## 2015-07-15 MED ORDER — LOSARTAN POTASSIUM-HCTZ 100-12.5 MG PO TABS: 1.0000 | ORAL_TABLET | Freq: Every day | ORAL | Status: DC

## 2015-07-15 NOTE — Assessment & Plan Note (Signed)
Pt's PE WNL w/ exception of obesity.  UTD on pap w/ Dr Pennie RushingHaygood.  Applauded her efforts at healthy diet and regular exercise.  Check labs.  Anticipatory guidance provided.

## 2015-07-15 NOTE — Progress Notes (Signed)
Pre visit review using our clinic review tool, if applicable. No additional management support is needed unless otherwise documented below in the visit note. 

## 2015-07-15 NOTE — Progress Notes (Signed)
   Subjective:    Patient ID: Taylor Frye, female    DOB: 10-29-1980, 35 y.o.   MRN: [id number]  HPI CPE- UTD on pap w/ Dr Pennie RushingHaygood.  Pt has lost 24 lbs!!  Pt reports she has given up carbs and attempting exercise more regularly.   Review of Systems Patient reports no vision/ hearing changes, adenopathy,fever, weight change,  persistant/recurrent hoarseness , swallowing issues, chest pain, palpitations, edema, persistant/recurrent cough, hemoptysis, dyspnea (rest/exertional/paroxysmal nocturnal), gastrointestinal bleeding (melena, rectal bleeding), abdominal pain, significant heartburn, bowel changes, GU symptoms (dysuria, hematuria, incontinence), Gyn symptoms (abnormal  bleeding, pain),  syncope, focal weakness, memory loss, numbness & tingling, skin/hair/nail changes, abnormal bruising or bleeding, anxiety, or depression.     Objective:   Physical Exam General Appearance:    Alert, cooperative, no distress, appears stated age, obese  Head:    Normocephalic, without obvious abnormality, atraumatic  Eyes:    PERRL, conjunctiva/corneas clear, EOM's intact, fundi    benign, both eyes  Ears:    Normal TM's and external ear canals, both ears  Nose:   Nares normal, septum midline, mucosa normal, no drainage    or sinus tenderness  Throat:   Lips, mucosa, and tongue normal; teeth and gums normal  Neck:   Supple, symmetrical, trachea midline, no adenopathy;    Thyroid: no enlargement/tenderness/nodules  Back:     Symmetric, no curvature, ROM normal, no CVA tenderness  Lungs:     Clear to auscultation bilaterally, respirations unlabored  Chest Wall:    No tenderness or deformity   Heart:    Regular rate and rhythm, S1 and S2 normal, no murmur, rub   or gallop  Breast Exam:    Deferred to GYN  Abdomen:     Soft, non-tender, bowel sounds active all four quadrants,    no masses, no organomegaly  Genitalia:    Deferred to GYN  Rectal:    Extremities:   Extremities normal, atraumatic, no  cyanosis or edema  Pulses:   2+ and symmetric all extremities  Skin:   Skin color, texture, turgor normal, no rashes or lesions  Lymph nodes:   Cervical, supraclavicular, and axillary nodes normal  Neurologic:   CNII-XII intact, normal strength, sensation and reflexes    throughout          Assessment & Plan:

## 2015-07-15 NOTE — Patient Instructions (Signed)
Follow up in 6 months to recheck BP We'll notify you of your lab results and make any changes if needed Keep up the good work on healthy diet and regular exercise- you are doing it!!! Call with any questions or concerns Happy Spring!!! Thanks for sticking with us!!!

## 2015-07-16 ENCOUNTER — Other Ambulatory Visit: Payer: Self-pay | Admitting: General Practice

## 2015-07-16 ENCOUNTER — Telehealth: Payer: Self-pay | Admitting: Family Medicine

## 2015-07-16 MED ORDER — VITAMIN D (ERGOCALCIFEROL) 1.25 MG (50000 UNIT) PO CAPS
50000.0000 [IU] | ORAL_CAPSULE | ORAL | Status: DC
Start: 2015-07-16 — End: 2016-05-19

## 2015-07-16 NOTE — Telephone Encounter (Signed)
Pt calling asking why was vitamin D was called into her pharmacy. She did not remember discussing this with Dr Beverely Lowabori

## 2015-07-16 NOTE — Telephone Encounter (Signed)
Pt informed that this medication was filled due to lab results that were sent to her mychart.

## 2015-08-28 DIAGNOSIS — M25562 Pain in left knee: Secondary | ICD-10-CM | POA: Diagnosis not present

## 2015-09-11 DIAGNOSIS — M25562 Pain in left knee: Secondary | ICD-10-CM | POA: Diagnosis not present

## 2015-09-16 ENCOUNTER — Other Ambulatory Visit: Payer: Self-pay | Admitting: Physical Medicine and Rehabilitation

## 2015-09-16 DIAGNOSIS — M25562 Pain in left knee: Secondary | ICD-10-CM

## 2015-09-25 ENCOUNTER — Ambulatory Visit
Admission: RE | Admit: 2015-09-25 | Discharge: 2015-09-25 | Disposition: A | Discharge status: Home / Self Care | Payer: BLUE CROSS/BLUE SHIELD | Source: Ambulatory Visit | Attending: Physical Medicine and Rehabilitation | Admitting: Physical Medicine and Rehabilitation

## 2015-09-25 DIAGNOSIS — M25562 Pain in left knee: Secondary | ICD-10-CM | POA: Diagnosis not present

## 2015-10-02 DIAGNOSIS — M25562 Pain in left knee: Secondary | ICD-10-CM | POA: Diagnosis not present

## 2015-10-02 DIAGNOSIS — M1712 Unilateral primary osteoarthritis, left knee: Secondary | ICD-10-CM | POA: Diagnosis not present

## 2015-10-08 DIAGNOSIS — M25562 Pain in left knee: Secondary | ICD-10-CM | POA: Diagnosis not present

## 2015-10-08 DIAGNOSIS — M1712 Unilateral primary osteoarthritis, left knee: Secondary | ICD-10-CM | POA: Diagnosis not present

## 2015-10-12 DIAGNOSIS — M1712 Unilateral primary osteoarthritis, left knee: Secondary | ICD-10-CM | POA: Diagnosis not present

## 2015-10-12 DIAGNOSIS — M25562 Pain in left knee: Secondary | ICD-10-CM | POA: Diagnosis not present

## 2015-10-14 DIAGNOSIS — M25562 Pain in left knee: Secondary | ICD-10-CM | POA: Diagnosis not present

## 2015-10-14 DIAGNOSIS — M1712 Unilateral primary osteoarthritis, left knee: Secondary | ICD-10-CM | POA: Diagnosis not present

## 2015-10-19 DIAGNOSIS — M25562 Pain in left knee: Secondary | ICD-10-CM | POA: Diagnosis not present

## 2015-10-19 DIAGNOSIS — M1712 Unilateral primary osteoarthritis, left knee: Secondary | ICD-10-CM | POA: Diagnosis not present

## 2015-10-21 ENCOUNTER — Other Ambulatory Visit: Payer: Self-pay | Admitting: Family Medicine

## 2015-10-21 NOTE — Telephone Encounter (Signed)
Rx denied.  Filled on 07/15/15-#17g,6//AB/CMA

## 2015-10-22 ENCOUNTER — Telehealth: Payer: Self-pay | Admitting: Family Medicine

## 2015-10-22 MED ORDER — ALBUTEROL SULFATE HFA 108 (90 BASE) MCG/ACT IN AERS: INHALATION_SPRAY | RESPIRATORY_TRACT | Status: DC

## 2015-10-22 NOTE — Telephone Encounter (Signed)
Pt states that [facility] (90 Base) MCG/ACT [facility]  Pt states that [facility] better [facility] [address]

## 2015-10-22 NOTE — Telephone Encounter (Signed)
Medication filled to pharmacy as requested.   

## 2016-01-12 ENCOUNTER — Ambulatory Visit: Payer: BLUE CROSS/BLUE SHIELD | Admitting: Family Medicine

## 2016-02-15 DIAGNOSIS — M659 Synovitis and tenosynovitis, unspecified: Secondary | ICD-10-CM | POA: Diagnosis not present

## 2016-02-15 DIAGNOSIS — M25561 Pain in right knee: Secondary | ICD-10-CM | POA: Diagnosis not present

## 2016-03-21 ENCOUNTER — Other Ambulatory Visit: Payer: Self-pay | Admitting: Family Medicine

## 2016-03-24 DIAGNOSIS — Z01419 Encounter for gynecological examination (general) (routine) without abnormal findings: Secondary | ICD-10-CM | POA: Diagnosis not present

## 2016-03-24 DIAGNOSIS — Z113 Encounter for screening for infections with a predominantly sexual mode of transmission: Secondary | ICD-10-CM | POA: Diagnosis not present

## 2016-03-24 DIAGNOSIS — E282 Polycystic ovarian syndrome: Secondary | ICD-10-CM | POA: Diagnosis not present

## 2016-03-24 DIAGNOSIS — N912 Amenorrhea, unspecified: Secondary | ICD-10-CM | POA: Diagnosis not present

## 2016-04-20 ENCOUNTER — Other Ambulatory Visit: Payer: Self-pay | Admitting: Family Medicine

## 2016-04-25 ENCOUNTER — Other Ambulatory Visit: Payer: Self-pay | Admitting: Family Medicine

## 2016-04-27 ENCOUNTER — Telehealth: Payer: Self-pay | Admitting: Family Medicine

## 2016-04-27 MED ORDER — LOSARTAN POTASSIUM-HCTZ 100-12.5 MG PO TABS: 1.0000 | ORAL_TABLET | Freq: Every day | ORAL | 0 refills | Status: DC

## 2016-04-27 MED ORDER — MONTELUKAST SODIUM 10 MG PO TABS: ORAL_TABLET | ORAL | 0 refills | Status: DC

## 2016-04-27 NOTE — Telephone Encounter (Signed)
Pt last refill on this medication was 03/21/16 #30 with 0. I can send in 30 days however pt is overdue for a BP follow up with PCP (should have had an appointment in September).   Cannot fill a 90 day supply without her being seen.

## 2016-04-27 NOTE — Telephone Encounter (Signed)
Patient calling to request 90 day refills for:  losartan-hydrochlorothiazide (HYZAAR) 100-12.5 MG tablet montelukast (SINGULAIR) 10 MG tablet   Patient has changed pharmacy.  She no longer uses [facility].  Please send this and all future ref[facility] to [facility] [address]

## 2016-04-27 NOTE — Telephone Encounter (Signed)
Left message on pt's voicmail informing her of Jessica's instructions.  Advised to call back to schedule appt to follow-up on BP

## 2016-05-04 ENCOUNTER — Ambulatory Visit: Payer: BLUE CROSS/BLUE SHIELD | Admitting: Family Medicine

## 2016-05-19 ENCOUNTER — Encounter: Payer: Self-pay | Admitting: Family Medicine

## 2016-05-19 ENCOUNTER — Ambulatory Visit (INDEPENDENT_AMBULATORY_CARE_PROVIDER_SITE_OTHER): Payer: BLUE CROSS/BLUE SHIELD | Admitting: Family Medicine

## 2016-05-19 VITALS — BP 131/84 | HR 98 | Temp 98.0°F | Resp 16 | Ht 65.0 in | Wt 395.0 lb

## 2016-05-19 DIAGNOSIS — Z23 Encounter for immunization: Secondary | ICD-10-CM | POA: Diagnosis not present

## 2016-05-19 DIAGNOSIS — Z6841 Body Mass Index (BMI) 40.0 and over, adult: Secondary | ICD-10-CM

## 2016-05-19 DIAGNOSIS — I10 Essential (primary) hypertension: Secondary | ICD-10-CM

## 2016-05-19 LAB — CBC WITH DIFFERENTIAL/PLATELET
Basophils Absolute: 0 10*3/uL (ref 0.0–0.1)
Basophils Relative: 0.7 % (ref 0.0–3.0)
EOS ABS: 0.1 10*3/uL (ref 0.0–0.7)
Eosinophils Relative: 2.5 % (ref 0.0–5.0)
HCT: 40.7 % (ref 36.0–46.0)
HEMOGLOBIN: 13.5 g/dL (ref 12.0–15.0)
LYMPHS ABS: 1.2 10*3/uL (ref 0.7–4.0)
Lymphocytes Relative: 25.3 % (ref 12.0–46.0)
MCHC: 33.2 g/dL (ref 30.0–36.0)
MCV: 89.6 fl (ref 78.0–100.0)
MONO ABS: 0.5 10*3/uL (ref 0.1–1.0)
Monocytes Relative: 10 % (ref 3.0–12.0)
NEUTROS PCT: 61.5 % (ref 43.0–77.0)
Neutro Abs: 3 10*3/uL (ref 1.4–7.7)
Platelets: 445 10*3/uL — ABNORMAL HIGH (ref 150.0–400.0)
RBC: 4.55 Mil/uL (ref 3.87–5.11)
RDW: 14 % (ref 11.5–15.5)
WBC: 4.9 10*3/uL (ref 4.0–10.5)

## 2016-05-19 LAB — TSH: TSH: 2.13 u[IU]/mL (ref 0.35–4.50)

## 2016-05-19 LAB — HEPATIC FUNCTION PANEL
ALT: 18 U/L (ref 0–35)
AST: 19 U/L (ref 0–37)
Albumin: 3.7 g/dL (ref 3.5–5.2)
Alkaline Phosphatase: 61 U/L (ref 39–117)
Bilirubin, Direct: 0.1 mg/dL (ref 0.0–0.3)
TOTAL PROTEIN: 8 g/dL (ref 6.0–8.3)
Total Bilirubin: 0.4 mg/dL (ref 0.2–1.2)

## 2016-05-19 LAB — HEMOGLOBIN A1C: HEMOGLOBIN A1C: 6.2 % (ref 4.6–6.5)

## 2016-05-19 LAB — BASIC METABOLIC PANEL
BUN: 14 mg/dL (ref 6–23)
CHLORIDE: 102 meq/L (ref 96–112)
CO2: 31 meq/L (ref 19–32)
CREATININE: 0.85 mg/dL (ref 0.40–1.20)
Calcium: 9.4 mg/dL (ref 8.4–10.5)
GFR: 97.03 mL/min (ref >60.00)
GLUCOSE: 97 mg/dL (ref 70–99)
Potassium: 4.7 mEq/L (ref 3.5–5.1)
Sodium: 137 mEq/L (ref 135–145)

## 2016-05-19 LAB — LIPID PANEL
CHOL/HDL RATIO: 5
CHOLESTEROL: 188 mg/dL (ref 0–200)
HDL: 38.4 mg/dL — AB (ref >39.00)
LDL Cholesterol: 128 mg/dL — ABNORMAL HIGH (ref 0–99)
NonHDL: 149.72
TRIGLYCERIDES: 107 mg/dL (ref 0.0–149.0)
VLDL: 21.4 mg/dL (ref 0.0–40.0)

## 2016-05-19 MED ORDER — JOLIVETTE 0.35 MG PO TABS: 1.0000 | ORAL_TABLET | Freq: Every day | ORAL | 6 refills | Status: DC

## 2016-05-19 MED ORDER — LOSARTAN POTASSIUM-HCTZ 100-12.5 MG PO TABS: 1.0000 | ORAL_TABLET | Freq: Every day | ORAL | 1 refills | Status: DC

## 2016-05-19 MED ORDER — BECLOMETHASONE DIPROPIONATE 80 MCG/ACT IN AERS: INHALATION_SPRAY | RESPIRATORY_TRACT | 6 refills | Status: DC

## 2016-05-19 MED ORDER — MONTELUKAST SODIUM 10 MG PO TABS: ORAL_TABLET | ORAL | 1 refills | Status: DC

## 2016-05-19 MED ORDER — ALBUTEROL SULFATE HFA 108 (90 BASE) MCG/ACT IN AERS: INHALATION_SPRAY | RESPIRATORY_TRACT | 6 refills | Status: DC

## 2016-05-19 NOTE — Progress Notes (Signed)
Pre visit review using our clinic review tool, if applicable. No additional management support is needed unless otherwise documented below in the visit note. 

## 2016-05-19 NOTE — Patient Instructions (Signed)
Schedule your complete physical in 6 months We'll notify you of your lab results and make any changes if needed Continue to work on healthy diet and regular exercise- you can do it! Call with any questions or concerns Happy Valentine's Day! 

## 2016-05-19 NOTE — Assessment & Plan Note (Signed)
Chronic problem.  Pt is down 3 lbs and reports she is trying to cut out carbs and exercise more regularly.  Check labs to risk stratify.  Will follow.

## 2016-05-19 NOTE — Progress Notes (Signed)
   Subjective:    Patient ID: Taylor Frye, female    DOB: [birth year], 36 y.o.   MRN: [id number]  HPI HTN- chronic problem.  On Losartan HCTZ daily.  Denies CP, SOB, HAs, visual changes, edema.  Pt has lost 3 lbs since last visit but BMI remains 65.  Not exercising regularly- will go to the gym when her knee doesn't bother her.  Eating a low salt diet.  Home BPs run in the 120s.     Review of Systems For ROS see HPI     Objective:   Physical Exam  Constitutional: She is oriented to person, place, and time. She appears well-developed and well-nourished. No distress.  obese  HENT:  Head: Normocephalic and atraumatic.  Eyes: Conjunctivae and EOM are normal. Pupils are equal, round, and reactive to light.  Neck: Normal range of motion. Neck supple. No thyromegaly present.  Cardiovascular: Normal rate, regular rhythm, normal heart sounds and intact distal pulses.   No murmur heard. Pulmonary/Chest: Effort normal and breath sounds normal. No respiratory distress.  Abdominal: Soft. She exhibits no distension. There is no tenderness.  Musculoskeletal: She exhibits no edema.  Lymphadenopathy:    She has no cervical adenopathy.  Neurological: She is alert and oriented to person, place, and time.  Skin: Skin is warm and dry.  Psychiatric: She has a normal mood and affect. Her behavior is normal.  Vitals reviewed.         Assessment & Plan:

## 2016-05-19 NOTE — Assessment & Plan Note (Signed)
Chronic problem.  Adequate control today.  Asymptomatic.  Stressed need for healthy diet and regular exercise.  Check labs.  No anticipated med changes.  Will follow.

## 2016-05-24 ENCOUNTER — Other Ambulatory Visit: Payer: Self-pay | Admitting: General Practice

## 2016-05-29 DIAGNOSIS — M1990 Unspecified osteoarthritis, unspecified site: Secondary | ICD-10-CM | POA: Diagnosis not present

## 2016-05-29 DIAGNOSIS — I10 Essential (primary) hypertension: Secondary | ICD-10-CM | POA: Diagnosis not present

## 2016-05-29 DIAGNOSIS — R079 Chest pain, unspecified: Secondary | ICD-10-CM | POA: Diagnosis not present

## 2016-05-29 DIAGNOSIS — Z79899 Other long term (current) drug therapy: Secondary | ICD-10-CM | POA: Diagnosis not present

## 2016-05-29 DIAGNOSIS — J45909 Unspecified asthma, uncomplicated: Secondary | ICD-10-CM | POA: Diagnosis not present

## 2016-06-07 ENCOUNTER — Ambulatory Visit: Payer: Self-pay | Admitting: Family Medicine

## 2016-06-07 DIAGNOSIS — Z0289 Encounter for other administrative examinations: Secondary | ICD-10-CM

## 2016-06-16 ENCOUNTER — Ambulatory Visit: Payer: BLUE CROSS/BLUE SHIELD | Admitting: Family Medicine

## 2016-06-20 DIAGNOSIS — M25562 Pain in left knee: Secondary | ICD-10-CM | POA: Diagnosis not present

## 2016-06-20 DIAGNOSIS — M25561 Pain in right knee: Secondary | ICD-10-CM | POA: Diagnosis not present

## 2016-08-04 DIAGNOSIS — M1712 Unilateral primary osteoarthritis, left knee: Secondary | ICD-10-CM | POA: Diagnosis not present

## 2016-08-04 DIAGNOSIS — M25561 Pain in right knee: Secondary | ICD-10-CM | POA: Diagnosis not present

## 2016-08-04 DIAGNOSIS — M25562 Pain in left knee: Secondary | ICD-10-CM | POA: Diagnosis not present

## 2016-08-04 DIAGNOSIS — M1711 Unilateral primary osteoarthritis, right knee: Secondary | ICD-10-CM | POA: Diagnosis not present

## 2016-08-08 DIAGNOSIS — M1712 Unilateral primary osteoarthritis, left knee: Secondary | ICD-10-CM | POA: Diagnosis not present

## 2016-08-08 DIAGNOSIS — M1711 Unilateral primary osteoarthritis, right knee: Secondary | ICD-10-CM | POA: Diagnosis not present

## 2016-08-08 DIAGNOSIS — M25562 Pain in left knee: Secondary | ICD-10-CM | POA: Diagnosis not present

## 2016-08-08 DIAGNOSIS — M25561 Pain in right knee: Secondary | ICD-10-CM | POA: Diagnosis not present

## 2016-08-30 DIAGNOSIS — M25561 Pain in right knee: Secondary | ICD-10-CM | POA: Diagnosis not present

## 2016-09-05 DIAGNOSIS — M25561 Pain in right knee: Secondary | ICD-10-CM | POA: Diagnosis not present

## 2016-09-06 DIAGNOSIS — Z6841 Body Mass Index (BMI) 40.0 and over, adult: Secondary | ICD-10-CM | POA: Diagnosis not present

## 2016-09-06 DIAGNOSIS — I10 Essential (primary) hypertension: Secondary | ICD-10-CM | POA: Diagnosis not present

## 2016-09-06 DIAGNOSIS — R7303 Prediabetes: Secondary | ICD-10-CM | POA: Diagnosis not present

## 2016-09-06 DIAGNOSIS — E8881 Metabolic syndrome: Secondary | ICD-10-CM | POA: Diagnosis not present

## 2016-10-25 DIAGNOSIS — R7303 Prediabetes: Secondary | ICD-10-CM | POA: Diagnosis not present

## 2016-10-25 DIAGNOSIS — Z713 Dietary counseling and surveillance: Secondary | ICD-10-CM | POA: Diagnosis not present

## 2016-10-26 DIAGNOSIS — Z6841 Body Mass Index (BMI) 40.0 and over, adult: Secondary | ICD-10-CM | POA: Diagnosis not present

## 2016-10-26 DIAGNOSIS — I10 Essential (primary) hypertension: Secondary | ICD-10-CM | POA: Diagnosis not present

## 2016-10-26 DIAGNOSIS — E8881 Metabolic syndrome: Secondary | ICD-10-CM | POA: Diagnosis not present

## 2016-10-26 DIAGNOSIS — M1712 Unilateral primary osteoarthritis, left knee: Secondary | ICD-10-CM | POA: Diagnosis not present

## 2016-10-26 DIAGNOSIS — M25562 Pain in left knee: Secondary | ICD-10-CM | POA: Diagnosis not present

## 2016-10-26 DIAGNOSIS — R7303 Prediabetes: Secondary | ICD-10-CM | POA: Diagnosis not present

## 2016-10-27 DIAGNOSIS — M25561 Pain in right knee: Secondary | ICD-10-CM | POA: Diagnosis not present

## 2016-10-27 DIAGNOSIS — M25562 Pain in left knee: Secondary | ICD-10-CM | POA: Diagnosis not present

## 2016-11-03 DIAGNOSIS — M1712 Unilateral primary osteoarthritis, left knee: Secondary | ICD-10-CM | POA: Diagnosis not present

## 2016-11-10 DIAGNOSIS — M1712 Unilateral primary osteoarthritis, left knee: Secondary | ICD-10-CM | POA: Diagnosis not present

## 2016-11-10 DIAGNOSIS — M25562 Pain in left knee: Secondary | ICD-10-CM | POA: Diagnosis not present

## 2016-11-16 ENCOUNTER — Encounter: Payer: BLUE CROSS/BLUE SHIELD | Admitting: Family Medicine

## 2016-12-04 ENCOUNTER — Other Ambulatory Visit: Payer: Self-pay | Admitting: Family Medicine

## 2016-12-07 DIAGNOSIS — Z713 Dietary counseling and surveillance: Secondary | ICD-10-CM | POA: Diagnosis not present

## 2017-01-12 DIAGNOSIS — Z6841 Body Mass Index (BMI) 40.0 and over, adult: Secondary | ICD-10-CM | POA: Diagnosis not present

## 2017-01-12 DIAGNOSIS — E8881 Metabolic syndrome: Secondary | ICD-10-CM | POA: Diagnosis not present

## 2017-01-12 DIAGNOSIS — G4730 Sleep apnea, unspecified: Secondary | ICD-10-CM | POA: Diagnosis not present

## 2017-01-12 DIAGNOSIS — I10 Essential (primary) hypertension: Secondary | ICD-10-CM | POA: Diagnosis not present

## 2017-01-12 DIAGNOSIS — R7303 Prediabetes: Secondary | ICD-10-CM | POA: Diagnosis not present

## 2017-01-16 ENCOUNTER — Other Ambulatory Visit: Payer: Self-pay | Admitting: General Practice

## 2017-01-16 MED ORDER — BECLOMETHASONE DIPROP HFA 80 MCG/ACT IN AERB: 1.0000 | INHALATION_SPRAY | Freq: Two times a day (BID) | RESPIRATORY_TRACT | 3 refills | Status: DC

## 2017-01-30 DIAGNOSIS — Z6841 Body Mass Index (BMI) 40.0 and over, adult: Secondary | ICD-10-CM | POA: Diagnosis not present

## 2017-01-30 DIAGNOSIS — R7303 Prediabetes: Secondary | ICD-10-CM | POA: Diagnosis not present

## 2017-01-30 DIAGNOSIS — R0683 Snoring: Secondary | ICD-10-CM | POA: Diagnosis not present

## 2017-02-14 DIAGNOSIS — Z713 Dietary counseling and surveillance: Secondary | ICD-10-CM | POA: Diagnosis not present

## 2017-02-24 ENCOUNTER — Encounter: Payer: BLUE CROSS/BLUE SHIELD | Admitting: Family Medicine

## 2017-02-25 DIAGNOSIS — G4710 Hypersomnia, unspecified: Secondary | ICD-10-CM | POA: Diagnosis not present

## 2017-03-02 ENCOUNTER — Other Ambulatory Visit: Payer: Self-pay | Admitting: Family Medicine

## 2017-03-30 DIAGNOSIS — Z3040 Encounter for surveillance of contraceptives, unspecified: Secondary | ICD-10-CM | POA: Diagnosis not present

## 2017-03-30 DIAGNOSIS — Z01419 Encounter for gynecological examination (general) (routine) without abnormal findings: Secondary | ICD-10-CM | POA: Diagnosis not present

## 2017-03-30 DIAGNOSIS — Z6841 Body Mass Index (BMI) 40.0 and over, adult: Secondary | ICD-10-CM | POA: Diagnosis not present

## 2017-03-30 DIAGNOSIS — Z113 Encounter for screening for infections with a predominantly sexual mode of transmission: Secondary | ICD-10-CM | POA: Diagnosis not present

## 2017-04-05 DIAGNOSIS — Z713 Dietary counseling and surveillance: Secondary | ICD-10-CM | POA: Diagnosis not present

## 2017-04-05 DIAGNOSIS — G4733 Obstructive sleep apnea (adult) (pediatric): Secondary | ICD-10-CM | POA: Diagnosis not present

## 2017-04-05 DIAGNOSIS — R7303 Prediabetes: Secondary | ICD-10-CM | POA: Diagnosis not present

## 2017-04-05 DIAGNOSIS — I10 Essential (primary) hypertension: Secondary | ICD-10-CM | POA: Diagnosis not present

## 2017-04-05 DIAGNOSIS — Z6841 Body Mass Index (BMI) 40.0 and over, adult: Secondary | ICD-10-CM | POA: Diagnosis not present

## 2017-04-05 DIAGNOSIS — G4719 Other hypersomnia: Secondary | ICD-10-CM | POA: Diagnosis not present

## 2017-04-14 DIAGNOSIS — Z8249 Family history of ischemic heart disease and other diseases of the circulatory system: Secondary | ICD-10-CM | POA: Diagnosis not present

## 2017-04-14 DIAGNOSIS — R0602 Shortness of breath: Secondary | ICD-10-CM | POA: Diagnosis not present

## 2017-04-14 DIAGNOSIS — G4733 Obstructive sleep apnea (adult) (pediatric): Secondary | ICD-10-CM | POA: Diagnosis not present

## 2017-04-14 DIAGNOSIS — I2699 Other pulmonary embolism without acute cor pulmonale: Secondary | ICD-10-CM | POA: Diagnosis not present

## 2017-04-14 DIAGNOSIS — R0789 Other chest pain: Secondary | ICD-10-CM | POA: Diagnosis not present

## 2017-04-14 DIAGNOSIS — Z8241 Family history of sudden cardiac death: Secondary | ICD-10-CM | POA: Diagnosis not present

## 2017-04-14 DIAGNOSIS — J45901 Unspecified asthma with (acute) exacerbation: Secondary | ICD-10-CM | POA: Diagnosis not present

## 2017-04-14 DIAGNOSIS — I071 Rheumatic tricuspid insufficiency: Secondary | ICD-10-CM | POA: Diagnosis not present

## 2017-04-14 DIAGNOSIS — R079 Chest pain, unspecified: Secondary | ICD-10-CM | POA: Diagnosis not present

## 2017-04-14 DIAGNOSIS — I214 Non-ST elevation (NSTEMI) myocardial infarction: Secondary | ICD-10-CM | POA: Diagnosis not present

## 2017-04-14 DIAGNOSIS — R739 Hyperglycemia, unspecified: Secondary | ICD-10-CM | POA: Diagnosis not present

## 2017-04-14 DIAGNOSIS — I10 Essential (primary) hypertension: Secondary | ICD-10-CM | POA: Diagnosis not present

## 2017-04-14 DIAGNOSIS — I517 Cardiomegaly: Secondary | ICD-10-CM | POA: Diagnosis not present

## 2017-04-14 DIAGNOSIS — Z6841 Body Mass Index (BMI) 40.0 and over, adult: Secondary | ICD-10-CM | POA: Diagnosis not present

## 2017-04-15 DIAGNOSIS — Z8241 Family history of sudden cardiac death: Secondary | ICD-10-CM | POA: Diagnosis not present

## 2017-04-15 DIAGNOSIS — R0789 Other chest pain: Secondary | ICD-10-CM | POA: Diagnosis not present

## 2017-04-15 DIAGNOSIS — I10 Essential (primary) hypertension: Secondary | ICD-10-CM | POA: Diagnosis not present

## 2017-04-16 DIAGNOSIS — I10 Essential (primary) hypertension: Secondary | ICD-10-CM | POA: Diagnosis not present

## 2017-04-16 DIAGNOSIS — Z8241 Family history of sudden cardiac death: Secondary | ICD-10-CM | POA: Diagnosis not present

## 2017-04-16 DIAGNOSIS — R0789 Other chest pain: Secondary | ICD-10-CM | POA: Diagnosis not present

## 2017-04-18 MED ORDER — PANTOPRAZOLE SODIUM 40 MG PO TBEC
40.00 | DELAYED_RELEASE_TABLET | ORAL | Status: DC
Start: 2017-04-17 — End: 2017-04-18

## 2017-04-18 MED ORDER — GENERIC EXTERNAL MEDICATION
Status: DC
Start: ? — End: 2017-04-18

## 2017-04-18 MED ORDER — METOPROLOL TARTRATE 25 MG PO TABS
25.00 | ORAL_TABLET | ORAL | Status: DC
Start: 2017-04-16 — End: 2017-04-18

## 2017-04-18 MED ORDER — ALBUTEROL SULFATE (2.5 MG/3ML) 0.083% IN NEBU
2.50 | INHALATION_SOLUTION | RESPIRATORY_TRACT | Status: DC
Start: ? — End: 2017-04-18

## 2017-04-18 MED ORDER — NITROGLYCERIN 0.4 MG SL SUBL
0.40 | SUBLINGUAL_TABLET | SUBLINGUAL | Status: DC
Start: ? — End: 2017-04-18

## 2017-04-18 MED ORDER — GENERIC EXTERNAL MEDICATION
1.00 | Status: DC
Start: 2017-04-16 — End: 2017-04-18

## 2017-04-18 MED ORDER — SODIUM CHLORIDE 0.9 % IV SOLN
INTRAVENOUS | Status: DC
Start: ? — End: 2017-04-18

## 2017-04-18 MED ORDER — WARFARIN SODIUM 1 MG PO TABS
ORAL_TABLET | ORAL | Status: DC
Start: ? — End: 2017-04-18

## 2017-04-18 MED ORDER — WARFARIN SODIUM 5 MG PO TABS
5.00 | ORAL_TABLET | ORAL | Status: DC
Start: ? — End: 2017-04-18

## 2017-04-18 MED ORDER — ACETAMINOPHEN 325 MG PO TABS
650.00 | ORAL_TABLET | ORAL | Status: DC
Start: ? — End: 2017-04-18

## 2017-04-19 ENCOUNTER — Encounter: Payer: Self-pay | Admitting: Family Medicine

## 2017-04-19 ENCOUNTER — Ambulatory Visit: Payer: BLUE CROSS/BLUE SHIELD | Admitting: Family Medicine

## 2017-04-19 VITALS — BP 124/80 | HR 96 | Temp 99.1°F | Ht 65.0 in | Wt 375.5 lb

## 2017-04-19 DIAGNOSIS — I2609 Other pulmonary embolism with acute cor pulmonale: Secondary | ICD-10-CM

## 2017-04-19 DIAGNOSIS — I10 Essential (primary) hypertension: Secondary | ICD-10-CM | POA: Diagnosis not present

## 2017-04-19 DIAGNOSIS — I2699 Other pulmonary embolism without acute cor pulmonale: Secondary | ICD-10-CM | POA: Diagnosis not present

## 2017-04-19 DIAGNOSIS — Z7901 Long term (current) use of anticoagulants: Secondary | ICD-10-CM | POA: Diagnosis not present

## 2017-04-19 LAB — PROTIME-INR
INR: 1.3 ratio — ABNORMAL HIGH (ref 0.8–1.0)
PROTHROMBIN TIME: 13.7 s — AB (ref 9.6–13.1)

## 2017-04-19 NOTE — Progress Notes (Signed)
Subjective  CC:  Chief Complaint  Patient presents with  . Hospitalization Follow-up    HPI: Taylor Frye is a 37 y.o. female who presents to the office today to address the problems listed above in the chief complaint. I've personally reviewed recent office visit notes, hospital notes, associated labs and imaging reports and/or pertinent outside office records via chart review or CareEverywhere. Briefly, 37 yo morbidly obese female with newly dx'd bilateral pulmonary embolisms with moderate clot burden by CTAP and acute cor pulmonale with right atrium enlargement by ECHO. On lovenox bridge and coumadin here for f/u and INR check. Has f/u with heme; coag disorder work up in progress. I do not see results in care everywhere yet. Stopped progesterone only BC.  Now on bid lovenox and coumadin 5mg  daily x 3 doses. No signs of bleeding. Anxious about dx but feeling back to herself. No calf pain or swelling.  I reviewed the patients updated PMH, FH, and SocHx.    Patient Active Problem List   Diagnosis Date Noted  . Amenorrhea 02/12/2014  . HTN (hypertension) 01/15/2014  . Low back pain 08/09/2012  . Edema 08/09/2012  . Morbid obesity with BMI of 60.0-69.9, adult (HCC)   . Seasonal allergies   . PCOS (polycystic ovarian syndrome)   . Oligomenorrhea   . HSV-2 infection   . Anovulation   . Ovarian cyst   . Knee pain 12/06/2011  . General medical examination 09/27/2010  . OBESITY 08/08/2008  . RHINITIS 08/08/2008  . ASTHMA 08/08/2008   Current Meds  Medication Sig  . albuterol (PROAIR HFA) 108 (90 Base) MCG/ACT inhaler INHALE TWO PUFFS BY MOUTH EVERY 4 HOURS AS NEEDED  . beclomethasone (QVAR REDIHALER) 80 MCG/ACT inhaler Inhale 1 puff into the lungs 2 (two) times daily.  Marland Kitchen. enoxaparin (LOVENOX) 100 MG/ML injection Inject into the skin.  Marland Kitchen. enoxaparin (LOVENOX) 80 MG/0.8ML injection Inject into the skin.  Marland Kitchen. losartan-hydrochlorothiazide (HYZAAR) 100-12.5 MG tablet TAKE ONE TABLET BY  MOUTH ONCE DAILY  . montelukast (SINGULAIR) 10 MG tablet TAKE ONE TABLET BY MOUTH AT BEDTIME  . QSYMIA 11.25-69 MG CP24   . warfarin (COUMADIN) 5 MG tablet Take by mouth.    Allergies: Patient has No Known Allergies. Family History: Patient family history includes Coronary artery disease in her mother; Heart failure in her brother and mother; Hypertension in her brother; Kidney disease in her father. Social History:  Patient  reports that  has never smoked. she has never used smokeless tobacco. She reports that she drinks alcohol. She reports that she does not use drugs.  Review of Systems: Constitutional: Negative for fever malaise or anorexia Cardiovascular: negative for chest pain Respiratory: negative for SOB or persistent cough Gastrointestinal: negative for abdominal pain  Objective  Vitals: BP 124/80 (BP Location: Left Arm, Patient Position: Sitting, Cuff Size: Large)   Pulse 96   Temp 99.1 F (37.3 C) (Oral)   Ht 5\' 5"  (1.651 m)   Wt (!) 375 lb 8 oz (170.3 kg)   LMP 03/23/2017   SpO2 98%   BMI 62.49 kg/m  General: no acute distress , A&Ox3, no respiratory distress HEENT: PEERL, conjunctiva normal, Oropharynx moist,neck is supple Cardiovascular:  RRR without murmur or gallop.  Respiratory:  Distant breath sounds bilaterally, CTAB with normal respiratory effort Skin:  Warm, no rashes  Assessment  1. Bilateral pulmonary embolism (HCC)   2. Anticoagulated   3. Essential hypertension   4. Acute cor pulmonale (HCC)  Plan   PE:  Counseling done. Respiratory status is stable.   Anticoagulation: check INR today and adjust coumadin dose accordingly. May need increase in dose. Continue lovenox in interim. May need coumadin clinic for f/u.   bp is stable.   Will need repeat 2D ECHO in 4 months to reevaluated enlarged right heart strain. Future order placed.   Follow up: Return in about 2 days (around 04/21/2017) for recheck INR.    Commons side effects, risks,  benefits, and alternatives for medications and treatment plan prescribed today were discussed, and the patient expressed understanding of the given instructions. Patient is instructed to call or message via MyChart if he/she has any questions or concerns regarding our treatment plan. No barriers to understanding were identified. We discussed Red Flag symptoms and signs in detail. Patient expressed understanding regarding what to do in case of urgent or emergency type symptoms.   Medication list was reconciled, printed and provided to the patient in AVS. Patient instructions and summary information was reviewed with the patient as documented in the AVS. This note was prepared with assistance of Dragon voice recognition software. Occasional wrong-word or sound-a-like substitutions may have occurred due to the inherent limitations of voice recognition software  Orders Placed This Encounter  Procedures  . Protime-INR  . ECHOCARDIOGRAM COMPLETE   No orders of the defined types were placed in this encounter.

## 2017-04-19 NOTE — Patient Instructions (Signed)
Please return in 2 days for recheck and INR check.  If you have any questions or concerns, please don't hesitate to send me a message via MyChart or call the office at [phone]. Thank you for visiting with us today! It's our pleasure caring for you.  Continue your coumadin and Lovenox today; I will notify you via Mychart with your results and next recommendations.

## 2017-04-21 ENCOUNTER — Ambulatory Visit: Payer: BLUE CROSS/BLUE SHIELD | Admitting: Family Medicine

## 2017-04-21 ENCOUNTER — Encounter: Payer: Self-pay | Admitting: Family Medicine

## 2017-04-21 VITALS — BP 130/80 | HR 114 | Temp 98.1°F | Ht 65.0 in | Wt 369.0 lb

## 2017-04-21 DIAGNOSIS — Z7901 Long term (current) use of anticoagulants: Secondary | ICD-10-CM | POA: Diagnosis not present

## 2017-04-21 DIAGNOSIS — I2699 Other pulmonary embolism without acute cor pulmonale: Secondary | ICD-10-CM | POA: Diagnosis not present

## 2017-04-21 MED ORDER — RIVAROXABAN 15 MG PO TABS: 15.0000 mg | ORAL_TABLET | Freq: Two times a day (BID) | ORAL | 0 refills | Status: DC

## 2017-04-21 MED ORDER — RIVAROXABAN (XARELTO) VTE STARTER PACK (15 & 20 MG): ORAL_TABLET | ORAL | 0 refills | Status: DC

## 2017-04-21 MED ORDER — RIVAROXABAN 20 MG PO TABS: 20.0000 mg | ORAL_TABLET | Freq: Every day | ORAL | 5 refills | Status: DC

## 2017-04-21 NOTE — Progress Notes (Signed)
Subjective  CC:  Chief Complaint  Patient presents with  . Bilateral Pulmonary Embolism    HPI: Taylor Frye is a 37 y.o. female who presents to the office today to address the problems listed above in the chief complaint.  Here for f/u on anticoagulation. Doing better; breathing almost normal. See last note for summary of recent hospitalization due to B PE. Started coumadin 10mg  yesterday, INR was 1.3. Last lovenox injection was this am.   No renal dysfunction.  I reviewed the patients updated PMH, FH, and SocHx.    Patient Active Problem List   Diagnosis Date Noted  . Amenorrhea 02/12/2014  . HTN (hypertension) 01/15/2014  . Low back pain 08/09/2012  . Edema 08/09/2012  . Morbid obesity with BMI of 60.0-69.9, adult (HCC)   . Seasonal allergies   . PCOS (polycystic ovarian syndrome)   . Oligomenorrhea   . HSV-2 infection   . Anovulation   . Ovarian cyst   . Knee pain 12/06/2011  . General medical examination 09/27/2010  . OBESITY 08/08/2008  . RHINITIS 08/08/2008  . ASTHMA 08/08/2008   Current Meds  Medication Sig  . albuterol (PROAIR HFA) 108 (90 Base) MCG/ACT inhaler INHALE TWO PUFFS BY MOUTH EVERY 4 HOURS AS NEEDED  . beclomethasone (QVAR REDIHALER) 80 MCG/ACT inhaler Inhale 1 puff into the lungs 2 (two) times daily.  Marland Kitchen enoxaparin (LOVENOX) 100 MG/ML injection Inject into the skin.  Marland Kitchen enoxaparin (LOVENOX) 80 MG/0.8ML injection Inject into the skin.  Marland Kitchen losartan-hydrochlorothiazide (HYZAAR) 100-12.5 MG tablet TAKE ONE TABLET BY MOUTH ONCE DAILY  . montelukast (SINGULAIR) 10 MG tablet TAKE ONE TABLET BY MOUTH AT BEDTIME  . QSYMIA 11.25-69 MG CP24   . warfarin (COUMADIN) 5 MG tablet Take by mouth.    Allergies: Patient has No Known Allergies. Family History: Patient family history includes Coronary artery disease in her mother; Heart failure in her brother and mother; Hypertension in her brother; Kidney disease in her father. Social History:  Patient  reports  that  has never smoked. she has never used smokeless tobacco. She reports that she drinks alcohol. She reports that she does not use drugs.  Review of Systems: Constitutional: Negative for fever malaise or anorexia Cardiovascular: negative for chest pain Respiratory: negative for SOB or persistent cough Gastrointestinal: negative for abdominal pain  Objective  Vitals: BP 130/80 (BP Location: Left Arm, Patient Position: Sitting, Cuff Size: Large)   Pulse (!) 114   Temp 98.1 F (36.7 C)   Ht 5\' 5"  (1.651 m)   Wt (!) 369 lb (167.4 kg)   LMP 03/23/2017   SpO2 97%   BMI 61.40 kg/m  General: no acute distress , A&Ox3 HEENT: PEERL, conjunctiva normal, Oropharynx moist,neck is supple Cardiovascular:  RRR without murmur or gallop.  Respiratory:  Good breath sounds bilaterally, CTAB with normal respiratory effort Skin:  Warm, no rashes No visits with results within 1 Day(s) from this visit.  Latest known visit with results is:  Office Visit on 04/19/2017  Component Date Value Ref Range Status  . INR 04/19/2017 1.3* 0.8 - 1.0 ratio Final  . Prothrombin Time 04/19/2017 13.7* 9.6 - 13.1 sec Final    Assessment  1. Bilateral pulmonary embolism (HCC)   2. Anticoagulated      Plan   PE tx:  Discussed options of treatment. No contraindications to xarelto; pt would prefer not to have to go to coumadin clinic. Discussed changing to oral xarelto; starter pak x 1 month, then 20mg  daily.  Has f/u with hematology for ongoing work up for hypercoaguable state  6 month temporary handicap placard given due to SOB due to PE.   Follow up: 4-8 weeks with Dr. Beverely Lowabori for recheck    Commons side effects, risks, benefits, and alternatives for medications and treatment plan prescribed today were discussed, and the patient expressed understanding of the given instructions. Patient is instructed to call or message via MyChart if he/she has any questions or concerns regarding our treatment plan. No barriers  to understanding were identified. We discussed Red Flag symptoms and signs in detail. Patient expressed understanding regarding what to do in case of urgent or emergency type symptoms.   Medication list was reconciled, printed and provided to the patient in AVS. Patient instructions and summary information was reviewed with the patient as documented in the AVS. This note was prepared with assistance of Dragon voice recognition software. Occasional wrong-word or sound-a-like substitutions may have occurred due to the inherent limitations of voice recognition software  No orders of the defined types were placed in this encounter.  No orders of the defined types were placed in this encounter.

## 2017-04-21 NOTE — Patient Instructions (Signed)
Please return in 4-8 weeks for recheck with Dr. Beverely Lowabori.  If you have any questions or concerns, please don't hesitate to send me a message via MyChart or call the office at [phone]. Thank you for visiting with us today! It's our pleasure caring for you.  STOP the shots and the coumadin.   Star xarelto today.

## 2017-05-01 DIAGNOSIS — I10 Essential (primary) hypertension: Secondary | ICD-10-CM | POA: Diagnosis not present

## 2017-05-01 DIAGNOSIS — Z5181 Encounter for therapeutic drug level monitoring: Secondary | ICD-10-CM | POA: Diagnosis not present

## 2017-05-01 DIAGNOSIS — Z6841 Body Mass Index (BMI) 40.0 and over, adult: Secondary | ICD-10-CM | POA: Diagnosis not present

## 2017-05-01 DIAGNOSIS — Z7901 Long term (current) use of anticoagulants: Secondary | ICD-10-CM | POA: Diagnosis not present

## 2017-05-01 DIAGNOSIS — Z803 Family history of malignant neoplasm of breast: Secondary | ICD-10-CM | POA: Diagnosis not present

## 2017-05-01 DIAGNOSIS — I2609 Other pulmonary embolism with acute cor pulmonale: Secondary | ICD-10-CM | POA: Diagnosis not present

## 2017-05-04 DIAGNOSIS — G4733 Obstructive sleep apnea (adult) (pediatric): Secondary | ICD-10-CM | POA: Diagnosis not present

## 2017-05-12 DIAGNOSIS — I10 Essential (primary) hypertension: Secondary | ICD-10-CM | POA: Diagnosis not present

## 2017-05-12 DIAGNOSIS — I2609 Other pulmonary embolism with acute cor pulmonale: Secondary | ICD-10-CM | POA: Diagnosis not present

## 2017-05-12 DIAGNOSIS — Z7901 Long term (current) use of anticoagulants: Secondary | ICD-10-CM | POA: Diagnosis not present

## 2017-05-12 DIAGNOSIS — Z6841 Body Mass Index (BMI) 40.0 and over, adult: Secondary | ICD-10-CM | POA: Diagnosis not present

## 2017-05-17 ENCOUNTER — Ambulatory Visit: Payer: Self-pay

## 2017-05-17 ENCOUNTER — Telehealth (HOSPITAL_COMMUNITY): Payer: Self-pay | Admitting: Family Medicine

## 2017-05-17 DIAGNOSIS — Z6841 Body Mass Index (BMI) 40.0 and over, adult: Secondary | ICD-10-CM | POA: Diagnosis not present

## 2017-05-17 DIAGNOSIS — I10 Essential (primary) hypertension: Secondary | ICD-10-CM | POA: Diagnosis not present

## 2017-05-17 DIAGNOSIS — I2609 Other pulmonary embolism with acute cor pulmonale: Secondary | ICD-10-CM | POA: Diagnosis not present

## 2017-05-17 DIAGNOSIS — I214 Non-ST elevation (NSTEMI) myocardial infarction: Secondary | ICD-10-CM | POA: Diagnosis not present

## 2017-05-17 DIAGNOSIS — R7303 Prediabetes: Secondary | ICD-10-CM | POA: Diagnosis not present

## 2017-05-17 NOTE — Telephone Encounter (Signed)
  Reason for Disposition . Cold with no complications  Answer Assessment - Initial Assessment Questions 1. ONSET: "When did the nasal discharge start?"      Started today 2. AMOUNT: "How much discharge is there?"      Clear 3. COUGH: "Do you have a cough?" If yes, ask: "Describe the color of your sputum" (clear, white, yellow, green)     No 4. RESPIRATORY DISTRESS: "Describe your breathing."      No distress 5. FEVER: "Do you have a fever?" If so, ask: "What is your temperature, how was it measured, and when did it start?"     No 6. SEVERITY: "Overall, how bad are you feeling right now?" (e.g., doesn't interfere with normal activities, staying home from school/work, staying in bed)      Moderate 7. OTHER SYMPTOMS: "Do you have any other symptoms?" (e.g., sore throat, earache, wheezing, vomiting)     No 8. PREGNANCY: "Is there any chance you are pregnant?" "When was your last menstrual period?"     No  Protocols used: COMMON COLD-A-AH Will try Coricidin HBP and home remedies.

## 2017-05-17 NOTE — Telephone Encounter (Signed)
User: [name], [name] Date/time: 05/17/17 9:25 AM  Comment: Called pt and lmsg for her to CB to sch echo.   Context:  Outcome: Left Message  Phone number: [phone] Phone Type: Home Phone  Comm. type: Telephone Call type: Outgoing  Contact: Taylor Frye, Taylor Frye Relation to patient: Self

## 2017-05-22 ENCOUNTER — Ambulatory Visit: Payer: BLUE CROSS/BLUE SHIELD | Admitting: Family Medicine

## 2017-05-22 ENCOUNTER — Other Ambulatory Visit: Payer: Self-pay

## 2017-05-22 ENCOUNTER — Encounter: Payer: Self-pay | Admitting: Family Medicine

## 2017-05-22 DIAGNOSIS — I2699 Other pulmonary embolism without acute cor pulmonale: Secondary | ICD-10-CM | POA: Diagnosis not present

## 2017-05-22 DIAGNOSIS — Z6841 Body Mass Index (BMI) 40.0 and over, adult: Secondary | ICD-10-CM | POA: Diagnosis not present

## 2017-05-22 NOTE — Assessment & Plan Note (Signed)
New to provider, ongoing for pt.  Currently asymptomatic.  About to start Xarelto 20mg  daily after completing the starting pack.  Has [facility] H[facility]ology at [facility].  Will follow.

## 2017-05-22 NOTE — Assessment & Plan Note (Signed)
Ongoing issue for pt.  She is following w/ Dr Cathey EndowBowen at [facility].  Stressed need for healthy diet and regular exercise.  Will follow along and assist as able.

## 2017-05-22 NOTE — Patient Instructions (Signed)
Schedule your complete physical at your convenience Continue to work on healthy diet and regular exercise Start the 20mg  Xarelto daily to prevent clotting Call with any questions or concerns Hang in there!!!

## 2017-05-22 NOTE — Progress Notes (Signed)
   Subjective:    Patient ID: Taylor Frye, female    DOB: Nov 11, 1980, 37 y.o.   MRN: [id number]  HPI Bilateral PE- since Dr Mardelle MatteAndy saw pt on 04/21/17 she has been to [facility] for evaluation.  She is currently on Xarelto.  Pt just finished starter back and will be picking up the 20mg  tabs.  Pt reports she is no longer having CP and breathing is easier.  Pt was told her clots were caused by 'weight and my birth control pills'.  Pt was told she will be on anticoagulation for 3-6 months.  Pt has not yet heard the results of her hypercoagulable work up.   No swelling of hands/feet.  No N/V.  No HAs.   Review of Systems For ROS see HPI     Objective:   Physical Exam  Constitutional: She is oriented to person, place, and time. She appears well-developed and well-nourished. No distress.  Morbidly obese  HENT:  Head: Normocephalic and atraumatic.  Eyes: Conjunctivae and EOM are normal. Pupils are equal, round, and reactive to light.  Neck: Normal range of motion. Neck supple. No thyromegaly present.  Cardiovascular: Normal rate, regular rhythm, normal heart sounds and intact distal pulses.  No murmur heard. Pulmonary/Chest: Effort normal and breath sounds normal. No respiratory distress.  Abdominal: Soft. She exhibits no distension. There is no tenderness.  Musculoskeletal: She exhibits no edema.  Lymphadenopathy:    She has no cervical adenopathy.  Neurological: She is alert and oriented to person, place, and time.  Skin: Skin is warm and dry.  Psychiatric: She has a normal mood and affect. Her behavior is normal.  Vitals reviewed.         Assessment & Plan:

## 2017-06-04 DIAGNOSIS — G4733 Obstructive sleep apnea (adult) (pediatric): Secondary | ICD-10-CM | POA: Diagnosis not present

## 2017-06-06 DIAGNOSIS — G4733 Obstructive sleep apnea (adult) (pediatric): Secondary | ICD-10-CM | POA: Diagnosis not present

## 2017-06-07 DIAGNOSIS — I10 Essential (primary) hypertension: Secondary | ICD-10-CM | POA: Diagnosis not present

## 2017-06-07 DIAGNOSIS — Z6841 Body Mass Index (BMI) 40.0 and over, adult: Secondary | ICD-10-CM | POA: Diagnosis not present

## 2017-06-07 DIAGNOSIS — I214 Non-ST elevation (NSTEMI) myocardial infarction: Secondary | ICD-10-CM | POA: Diagnosis not present

## 2017-06-07 DIAGNOSIS — Z9989 Dependence on other enabling machines and devices: Secondary | ICD-10-CM | POA: Diagnosis not present

## 2017-06-07 DIAGNOSIS — G4733 Obstructive sleep apnea (adult) (pediatric): Secondary | ICD-10-CM | POA: Diagnosis not present

## 2017-06-16 DIAGNOSIS — M1712 Unilateral primary osteoarthritis, left knee: Secondary | ICD-10-CM | POA: Diagnosis not present

## 2017-06-16 DIAGNOSIS — M1711 Unilateral primary osteoarthritis, right knee: Secondary | ICD-10-CM | POA: Diagnosis not present

## 2017-06-19 ENCOUNTER — Other Ambulatory Visit: Payer: Self-pay | Admitting: General Practice

## 2017-06-19 MED ORDER — MONTELUKAST SODIUM 10 MG PO TABS: 10.0000 mg | ORAL_TABLET | Freq: Every day | ORAL | 1 refills | Status: DC

## 2017-06-19 MED ORDER — LOSARTAN POTASSIUM-HCTZ 100-12.5 MG PO TABS: 1.0000 | ORAL_TABLET | Freq: Every day | ORAL | 1 refills | Status: DC

## 2017-07-02 DIAGNOSIS — G4733 Obstructive sleep apnea (adult) (pediatric): Secondary | ICD-10-CM | POA: Diagnosis not present

## 2017-07-17 ENCOUNTER — Other Ambulatory Visit: Payer: Self-pay

## 2017-07-17 ENCOUNTER — Encounter: Payer: Self-pay | Admitting: Family Medicine

## 2017-07-17 ENCOUNTER — Ambulatory Visit: Payer: BLUE CROSS/BLUE SHIELD | Admitting: Family Medicine

## 2017-07-17 VITALS — BP 112/76 | HR 76 | Temp 98.7°F | Resp 16 | Ht 65.0 in | Wt 369.6 lb

## 2017-07-17 DIAGNOSIS — R21 Rash and other nonspecific skin eruption: Secondary | ICD-10-CM | POA: Diagnosis not present

## 2017-07-17 DIAGNOSIS — Z86711 Personal history of pulmonary embolism: Secondary | ICD-10-CM | POA: Diagnosis not present

## 2017-07-17 DIAGNOSIS — L989 Disorder of the skin and subcutaneous tissue, unspecified: Secondary | ICD-10-CM | POA: Diagnosis not present

## 2017-07-17 DIAGNOSIS — I10 Essential (primary) hypertension: Secondary | ICD-10-CM | POA: Diagnosis not present

## 2017-07-17 DIAGNOSIS — L03313 Cellulitis of chest wall: Secondary | ICD-10-CM | POA: Diagnosis not present

## 2017-07-17 DIAGNOSIS — Z7901 Long term (current) use of anticoagulants: Secondary | ICD-10-CM | POA: Diagnosis not present

## 2017-07-17 DIAGNOSIS — B029 Zoster without complications: Secondary | ICD-10-CM | POA: Diagnosis not present

## 2017-07-17 DIAGNOSIS — Z794 Long term (current) use of insulin: Secondary | ICD-10-CM | POA: Diagnosis not present

## 2017-07-17 DIAGNOSIS — R509 Fever, unspecified: Secondary | ICD-10-CM | POA: Diagnosis not present

## 2017-07-17 DIAGNOSIS — R000 Tachycardia, unspecified: Secondary | ICD-10-CM | POA: Diagnosis not present

## 2017-07-17 DIAGNOSIS — Z79899 Other long term (current) drug therapy: Secondary | ICD-10-CM | POA: Diagnosis not present

## 2017-07-17 DIAGNOSIS — E11628 Type 2 diabetes mellitus with other skin complications: Secondary | ICD-10-CM | POA: Diagnosis not present

## 2017-07-17 MED ORDER — TRAMADOL HCL 50 MG PO TABS: 50.0000 mg | ORAL_TABLET | Freq: Four times a day (QID) | ORAL | 0 refills | Status: DC | PRN

## 2017-07-17 MED ORDER — VALACYCLOVIR HCL 1 G PO TABS: 1000.0000 mg | ORAL_TABLET | Freq: Three times a day (TID) | ORAL | 0 refills | Status: DC

## 2017-07-17 NOTE — Patient Instructions (Addendum)
Please follow up if symptoms do not improve or as needed.   You may use Capsaicin cream twice a day for pain.    Shingles Shingles, which is also known as herpes zoster, is an infection that causes a painful skin rash and fluid-filled blisters. Shingles is not related to genital herpes, which is a sexually transmitted infection. Shingles only develops in people who:  Have had chickenpox.  Have received the chickenpox vaccine. (This is rare.)  What are the causes? Shingles is caused by varicella-zoster virus (VZV). This is the same virus that causes chickenpox. After exposure to VZV, the virus stays in the body in an inactive (dormant) state. Shingles develops if the virus reactivates. This can happen many years after the initial exposure to VZV. It is not known what causes this virus to reactivate. What increases the risk? People who have had chickenpox or received the chickenpox vaccine are at risk for shingles. Infection is more common in people who:  Are older than age 38.  Have a weakened defense (immune) system, such as those with HIV, AIDS, or cancer.  Are taking medicines that weaken the immune system, such as transplant medicines.  Are under great stress.  What are the signs or symptoms? Early symptoms of this condition include itching, tingling, and pain in an area on your skin. Pain may be described as burning, stabbing, or throbbing. A few days or weeks after symptoms start, a painful red rash appears, usually on one side of the body in a bandlike or beltlike pattern. The rash eventually turns into fluid-filled blisters that break open, scab over, and dry up in about 2-3 weeks. At any time during the infection, you may also develop:  A fever.  Chills.  A headache.  An upset stomach.  How is this diagnosed? This condition is diagnosed with a skin exam. Sometimes, skin or fluid samples are taken from the blisters before a diagnosis is made. These samples are examined  under a microscope or sent to a lab for testing. How is this treated? There is no specific cure for this condition. Your health care provider will probably prescribe medicines to help you manage pain, recover more quickly, and avoid long-term problems. Medicines may include:  Antiviral drugs.  Anti-inflammatory drugs.  Pain medicines.  If the area involved is on your face, you may be referred to a specialist, such as an eye doctor (ophthalmologist) or an ear, nose, and throat (ENT) doctor to help you avoid eye problems, chronic pain, or disability. Follow these instructions at home: Medicines  Take medicines only as directed by your health care provider.  Apply an anti-itch or numbing cream to the affected area as directed by your health care provider. Blister and Rash Care  Take a cool bath or apply cool compresses to the area of the rash or blisters as directed by your health care provider. This may help with pain and itching.  Keep your rash covered with a loose bandage (dressing). Wear loose-fitting clothing to help ease the pain of material rubbing against the rash.  Keep your rash and blisters clean with mild soap and cool water or as directed by your health care provider.  Check your rash every day for signs of infection. These include redness, swelling, and pain that lasts or increases.  Do not pick your blisters.  Do not scratch your rash. General instructions  Rest as directed by your health care provider.  Keep all follow-up visits as directed by your health  care provider. This is important.  Until your blisters scab over, your infection can cause chickenpox in people who have never had it or been vaccinated against it. To prevent this from happening, avoid contact with other people, especially: ? Babies. ? Pregnant women. ? Children who have eczema. ? Elderly people who have transplants. ? People who have chronic illnesses, such as leukemia or AIDS. Contact a  health care provider if:  Your pain is not relieved with prescribed medicines.  Your pain does not get better after the rash heals.  Your rash looks infected. Signs of infection include redness, swelling, and pain that lasts or increases. Get help right away if:  The rash is on your face or nose.  You have facial pain, pain around your eye area, or loss of feeling on one side of your face.  You have ear pain or you have ringing in your ear.  You have loss of taste.  Your condition gets worse. This information is not intended to replace advice given to you by your health care provider. Make sure you discuss any questions you have with your health care provider. Document Released: 04/04/2005 Document Revised: 11/29/2015 Document Reviewed: 02/13/2014 Elsevier Interactive Patient Education  2018 [employer].

## 2017-07-17 NOTE — Progress Notes (Signed)
Subjective  CC:  Chief Complaint  Patient presents with  . Rash    follow up from ER this AM, bumps under right breast and middle of chest, painful and sore, had a fever this AM    HPI: Taylor Frye is a 37 y.o. female who presents to the office today to address the problems listed above in the chief complaint.  I reviewed ER notes and results: dxd with cellulitis. Pt had fever and painful blistering rash that started mid chest; now spreading in dermatomal fashion. No sob or rash on left side. No more fever today. ER MD was concerned about warts and or cellulitis.   I reviewed the patients updated PMH, FH, and SocHx.    Patient Active Problem List   Diagnosis Date Noted  . Bilateral pulmonary embolism (HCC) 05/22/2017  . Amenorrhea 02/12/2014  . HTN (hypertension) 01/15/2014  . Low back pain 08/09/2012  . Edema 08/09/2012  . Morbid obesity with BMI of 60.0-69.9, adult (HCC)   . Seasonal allergies   . PCOS (polycystic ovarian syndrome)   . Oligomenorrhea   . HSV-2 infection   . Anovulation   . Ovarian cyst   . Knee pain 12/06/2011  . General medical examination 09/27/2010  . RHINITIS 08/08/2008  . ASTHMA 08/08/2008   Current Meds  Medication Sig  . albuterol (PROAIR HFA) 108 (90 Base) MCG/ACT inhaler INHALE TWO PUFFS BY MOUTH EVERY 4 HOURS AS NEEDED  . beclomethasone (QVAR REDIHALER) 80 MCG/ACT inhaler Inhale 1 puff into the lungs 2 (two) times daily.  Marland Kitchen. losartan-hydrochlorothiazide (HYZAAR) 100-12.5 MG tablet Take 1 tablet by mouth daily.  . montelukast (SINGULAIR) 10 MG tablet Take 1 tablet (10 mg total) by mouth at bedtime.  . rivaroxaban (XARELTO) 20 MG TABS tablet Take 1 tablet (20 mg total) by mouth daily with supper.  . sulfamethoxazole-trimethoprim (BACTRIM DS,SEPTRA DS) 800-160 MG tablet Take 1 tablet by mouth 2 (two) times daily.     Allergies: Patient has No Known Allergies. Family History: Patient family history includes Coronary artery disease in her  mother; Heart failure in her brother and mother; Hypertension in her brother; Kidney disease in her father. Social History:  Patient  reports that she has never smoked. She has never used smokeless tobacco. She reports that she drinks alcohol. She reports that she does not use drugs.  Review of Systems: Constitutional: Negative for fever malaise or anorexia Cardiovascular: negative for chest pain Respiratory: negative for SOB or persistent cough Gastrointestinal: negative for abdominal pain  Objective  Vitals: BP 112/76   Pulse 76   Temp 98.7 F (37.1 C) (Oral)   Resp 16   Ht 5\' 5"  (1.651 m)   Wt (!) 369 lb 9.6 oz (167.6 kg)   LMP 01/16/2017   SpO2 97%   BMI 61.50 kg/m  General: no acute distress , A&Ox3 Cardiovascular:  RRR without murmur or gallop.  Respiratory:  Good breath sounds bilaterally, CTAB with normal respiratory effort Skin:  Warm, right blistering rash on erythematous base in dermatomal distribution beneath right breast  Assessment  1. Herpes zoster without complication      Plan   shingles:  Discussed dx and prognosis. Start antivirals, stop antibiotics. Treat pain with tramadol. Capsaicin cream. Discussed signs of secondary infection.   Follow up: prn    Commons side effects, risks, benefits, and alternatives for medications and treatment plan prescribed today were discussed, and the patient expressed understanding of the given instructions. Patient is instructed to call or  message via MyChart if he/she has any questions or concerns regarding our treatment plan. No barriers to understanding were identified. We discussed Red Flag symptoms and signs in detail. Patient expressed understanding regarding what to do in case of urgent or emergency type symptoms.   Medication list was reconciled, printed and provided to the patient in AVS. Patient instructions and summary information was reviewed with the patient as documented in the AVS. This note was prepared with  assistance of Dragon voice recognition software. Occasional wrong-word or sound-a-like substitutions may have occurred due to the inherent limitations of voice recognition software  No orders of the defined types were placed in this encounter.  Meds ordered this encounter  Medications  . valACYclovir (VALTREX) 1000 MG tablet    Sig: Take 1 tablet (1,000 mg total) by mouth 3 (three) times daily.    Dispense:  21 tablet    Refill:  0  . traMADol (ULTRAM) 50 MG tablet    Sig: Take 1 tablet (50 mg total) by mouth every 6 (six) hours as needed for moderate pain.    Dispense:  30 tablet    Refill:  0

## 2017-07-19 ENCOUNTER — Ambulatory Visit: Payer: BLUE CROSS/BLUE SHIELD | Admitting: Family Medicine

## 2017-07-31 ENCOUNTER — Encounter: Payer: Self-pay | Admitting: Family Medicine

## 2017-07-31 ENCOUNTER — Encounter: Payer: BLUE CROSS/BLUE SHIELD | Admitting: Family Medicine

## 2017-07-31 ENCOUNTER — Ambulatory Visit (INDEPENDENT_AMBULATORY_CARE_PROVIDER_SITE_OTHER): Payer: BLUE CROSS/BLUE SHIELD | Admitting: Family Medicine

## 2017-07-31 ENCOUNTER — Other Ambulatory Visit: Payer: Self-pay

## 2017-07-31 VITALS — BP 120/81 | HR 68 | Temp 98.0°F | Resp 16 | Ht 65.0 in | Wt 363.1 lb

## 2017-07-31 DIAGNOSIS — I2609 Other pulmonary embolism with acute cor pulmonale: Secondary | ICD-10-CM | POA: Diagnosis not present

## 2017-07-31 DIAGNOSIS — Z6841 Body Mass Index (BMI) 40.0 and over, adult: Secondary | ICD-10-CM

## 2017-07-31 DIAGNOSIS — Z0000 Encounter for general adult medical examination without abnormal findings: Secondary | ICD-10-CM

## 2017-07-31 DIAGNOSIS — E559 Vitamin D deficiency, unspecified: Secondary | ICD-10-CM | POA: Diagnosis not present

## 2017-07-31 DIAGNOSIS — I2699 Other pulmonary embolism without acute cor pulmonale: Secondary | ICD-10-CM

## 2017-07-31 DIAGNOSIS — I2782 Chronic pulmonary embolism: Secondary | ICD-10-CM | POA: Diagnosis not present

## 2017-07-31 DIAGNOSIS — Z5181 Encounter for therapeutic drug level monitoring: Secondary | ICD-10-CM | POA: Diagnosis not present

## 2017-07-31 DIAGNOSIS — Z7901 Long term (current) use of anticoagulants: Secondary | ICD-10-CM | POA: Diagnosis not present

## 2017-07-31 LAB — LIPID PANEL
CHOL/HDL RATIO: 4
CHOLESTEROL: 172 mg/dL (ref 0–200)
HDL: 38.3 mg/dL — ABNORMAL LOW (ref >39.00)
LDL CALC: 109 mg/dL — AB (ref 0–99)
NonHDL: 133.39
TRIGLYCERIDES: 120 mg/dL (ref 0.0–149.0)
VLDL: 24 mg/dL (ref 0.0–40.0)

## 2017-07-31 LAB — HEPATIC FUNCTION PANEL
ALBUMIN: 3.4 g/dL — AB (ref 3.5–5.2)
ALK PHOS: 56 U/L (ref 39–117)
ALT: 15 U/L (ref 0–35)
AST: 12 U/L (ref 0–37)
Bilirubin, Direct: 0.1 mg/dL (ref 0.0–0.3)
TOTAL PROTEIN: 7.5 g/dL (ref 6.0–8.3)
Total Bilirubin: 0.3 mg/dL (ref 0.2–1.2)

## 2017-07-31 LAB — CBC WITH DIFFERENTIAL/PLATELET
BASOS ABS: 0 10*3/uL (ref 0.0–0.1)
Basophils Relative: 0.2 % (ref 0.0–3.0)
EOS PCT: 1.1 % (ref 0.0–5.0)
Eosinophils Absolute: 0.1 10*3/uL (ref 0.0–0.7)
HCT: 38.5 % (ref 36.0–46.0)
HEMOGLOBIN: 12.9 g/dL (ref 12.0–15.0)
LYMPHS ABS: 1.7 10*3/uL (ref 0.7–4.0)
Lymphocytes Relative: 32.5 % (ref 12.0–46.0)
MCHC: 33.5 g/dL (ref 30.0–36.0)
MCV: 87.8 fl (ref 78.0–100.0)
MONO ABS: 0.5 10*3/uL (ref 0.1–1.0)
MONOS PCT: 10.5 % (ref 3.0–12.0)
Neutro Abs: 2.9 10*3/uL (ref 1.4–7.7)
Neutrophils Relative %: 55.7 % (ref 43.0–77.0)
PLATELETS: 390 10*3/uL (ref 150.0–400.0)
RBC: 4.39 Mil/uL (ref 3.87–5.11)
RDW: 15.3 % (ref 11.5–15.5)
WBC: 5.1 10*3/uL (ref 4.0–10.5)

## 2017-07-31 LAB — TSH: TSH: 1.23 u[IU]/mL (ref 0.35–4.50)

## 2017-07-31 LAB — BASIC METABOLIC PANEL
BUN: 16 mg/dL (ref 6–23)
CO2: 26 mEq/L (ref 19–32)
CREATININE: 0.71 mg/dL (ref 0.40–1.20)
Calcium: 9 mg/dL (ref 8.4–10.5)
Chloride: 103 mEq/L (ref 96–112)
GFR: 118.65 mL/min (ref >60.00)
GLUCOSE: 98 mg/dL (ref 70–99)
Potassium: 4 mEq/L (ref 3.5–5.1)
Sodium: 135 mEq/L (ref 135–145)

## 2017-07-31 LAB — VITAMIN D 25 HYDROXY (VIT D DEFICIENCY, FRACTURES): VITD: 28.64 ng/mL — AB (ref 30.00–100.00)

## 2017-07-31 LAB — HEMOGLOBIN A1C: Hgb A1c MFr Bld: 6 % (ref 4.6–6.5)

## 2017-07-31 NOTE — Patient Instructions (Signed)
Follow up in 6 months We'll notify you of your lab results and make any changes if needed Keep up the good work on healthy diet and regular exercise- you are doing it!!! Have Dr Pennie RushingHaygood send me a copy of her notes later this year Call with any questions or concerns Happy Spring!!!

## 2017-07-31 NOTE — Assessment & Plan Note (Signed)
Pt has hx of this.  Check labs and replete prn. 

## 2017-07-31 NOTE — Assessment & Plan Note (Signed)
Currently on xarelto- has Heme appt this afternoon.

## 2017-07-31 NOTE — Assessment & Plan Note (Signed)
Pt's PE WNL w/ exception of obesity.  UTD on GYN, Tdap.  Check labs.  Anticipatory guidance provided.  

## 2017-07-31 NOTE — Assessment & Plan Note (Signed)
Ongoing issue for pt.  She is down 7 lbs from last visit.  Applauded her efforts.  Check labs to risk stratify.  Will follow closely.

## 2017-07-31 NOTE — Progress Notes (Signed)
   Subjective:    Patient ID: Taylor Frye, female    DOB: [birth year], 37 y.o.   MRN: [id number]  HPI CPE- UTD on GYN (Haygood).  UTD on Tdap.  Pt is down 7 lbs since last visit- she is now exercising 'at least 10 minutes a day'.     Review of Systems Patient reports no vision/ hearing changes, adenopathy,fever, weight change,  persistant/recurrent hoarseness , swallowing issues, chest pain, palpitations, edema, persistant/recurrent cough, hemoptysis, dyspnea (rest/exertional/paroxysmal nocturnal), gastrointestinal bleeding (melena, rectal bleeding), abdominal pain, significant heartburn, bowel changes, GU symptoms (dysuria, hematuria, incontinence), Gyn symptoms (abnormal  bleeding, pain),  syncope, focal weakness, memory loss, numbness & tingling, skin/hair/nail changes, abnormal bruising or bleeding, anxiety, or depression.     Objective:   Physical Exam General Appearance:    Alert, cooperative, no distress, appears stated age, obese  Head:    Normocephalic, without obvious abnormality, atraumatic  Eyes:    PERRL, conjunctiva/corneas clear, EOM's intact, fundi    benign, both eyes  Ears:    Normal TM's and external ear canals, both ears  Nose:   Nares normal, septum midline, mucosa normal, no drainage    or sinus tenderness  Throat:   Lips, mucosa, and tongue normal; teeth and gums normal  Neck:   Supple, symmetrical, trachea midline, no adenopathy;    Thyroid: no enlargement/tenderness/nodules  Back:     Symmetric, no curvature, ROM normal, no CVA tenderness  Lungs:     Clear to auscultation bilaterally, respirations unlabored  Chest Wall:    No tenderness or deformity   Heart:    Regular rate and rhythm, S1 and S2 normal, no murmur, rub   or gallop  Breast Exam:    Deferred to GYN  Abdomen:     Soft, non-tender, bowel sounds active all four quadrants,    no masses, no organomegaly  Genitalia:    Deferred to GYN  Rectal:    Extremities:   Extremities normal, atraumatic, no  cyanosis or edema  Pulses:   2+ and symmetric all extremities  Skin:   Skin color, texture, turgor normal, no rashes or lesions  Lymph nodes:   Cervical, supraclavicular, and axillary nodes normal  Neurologic:   CNII-XII intact, normal strength, sensation and reflexes    throughout          Assessment & Plan:

## 2017-08-01 ENCOUNTER — Encounter: Payer: Self-pay | Admitting: General Practice

## 2017-08-02 DIAGNOSIS — G4733 Obstructive sleep apnea (adult) (pediatric): Secondary | ICD-10-CM | POA: Diagnosis not present

## 2017-08-14 ENCOUNTER — Ambulatory Visit (HOSPITAL_COMMUNITY): Payer: BLUE CROSS/BLUE SHIELD | Attending: Cardiovascular Disease

## 2017-08-14 ENCOUNTER — Other Ambulatory Visit: Payer: Self-pay

## 2017-08-14 DIAGNOSIS — I2699 Other pulmonary embolism without acute cor pulmonale: Secondary | ICD-10-CM | POA: Insufficient documentation

## 2017-08-14 DIAGNOSIS — I10 Essential (primary) hypertension: Secondary | ICD-10-CM | POA: Diagnosis not present

## 2017-08-14 DIAGNOSIS — Z6841 Body Mass Index (BMI) 40.0 and over, adult: Secondary | ICD-10-CM | POA: Insufficient documentation

## 2017-08-14 DIAGNOSIS — I2609 Other pulmonary embolism with acute cor pulmonale: Secondary | ICD-10-CM | POA: Diagnosis not present

## 2017-08-22 DIAGNOSIS — M25461 Effusion, right knee: Secondary | ICD-10-CM | POA: Diagnosis not present

## 2017-08-22 DIAGNOSIS — M25462 Effusion, left knee: Secondary | ICD-10-CM | POA: Diagnosis not present

## 2017-09-01 DIAGNOSIS — G4733 Obstructive sleep apnea (adult) (pediatric): Secondary | ICD-10-CM | POA: Diagnosis not present

## 2017-09-26 DIAGNOSIS — M1711 Unilateral primary osteoarthritis, right knee: Secondary | ICD-10-CM | POA: Diagnosis not present

## 2017-09-26 DIAGNOSIS — M1712 Unilateral primary osteoarthritis, left knee: Secondary | ICD-10-CM | POA: Diagnosis not present

## 2017-10-02 DIAGNOSIS — G4733 Obstructive sleep apnea (adult) (pediatric): Secondary | ICD-10-CM | POA: Diagnosis not present

## 2017-10-03 DIAGNOSIS — M1711 Unilateral primary osteoarthritis, right knee: Secondary | ICD-10-CM | POA: Diagnosis not present

## 2017-10-03 DIAGNOSIS — M1712 Unilateral primary osteoarthritis, left knee: Secondary | ICD-10-CM | POA: Diagnosis not present

## 2017-10-10 DIAGNOSIS — M1711 Unilateral primary osteoarthritis, right knee: Secondary | ICD-10-CM | POA: Diagnosis not present

## 2017-10-10 DIAGNOSIS — M1712 Unilateral primary osteoarthritis, left knee: Secondary | ICD-10-CM | POA: Diagnosis not present

## 2017-11-01 DIAGNOSIS — G4733 Obstructive sleep apnea (adult) (pediatric): Secondary | ICD-10-CM | POA: Diagnosis not present

## 2017-11-20 ENCOUNTER — Other Ambulatory Visit: Payer: Self-pay | Admitting: Family Medicine

## 2017-12-02 DIAGNOSIS — G4733 Obstructive sleep apnea (adult) (pediatric): Secondary | ICD-10-CM | POA: Diagnosis not present

## 2017-12-06 ENCOUNTER — Other Ambulatory Visit: Payer: Self-pay | Admitting: Family Medicine

## 2017-12-06 NOTE — Telephone Encounter (Signed)
Received and reviewed medication refill request.  Request is appropriate and was approved.  Please see medication orders for details.   Amy Peterman,  LPN   

## 2017-12-27 ENCOUNTER — Other Ambulatory Visit: Payer: Self-pay | Admitting: Family Medicine

## 2018-01-02 DIAGNOSIS — G4733 Obstructive sleep apnea (adult) (pediatric): Secondary | ICD-10-CM | POA: Diagnosis not present

## 2018-01-30 ENCOUNTER — Ambulatory Visit: Payer: BLUE CROSS/BLUE SHIELD | Admitting: Family Medicine

## 2018-01-30 DIAGNOSIS — M1712 Unilateral primary osteoarthritis, left knee: Secondary | ICD-10-CM | POA: Diagnosis not present

## 2018-01-30 DIAGNOSIS — M1711 Unilateral primary osteoarthritis, right knee: Secondary | ICD-10-CM | POA: Diagnosis not present

## 2018-02-01 DIAGNOSIS — G4733 Obstructive sleep apnea (adult) (pediatric): Secondary | ICD-10-CM | POA: Diagnosis not present

## 2018-02-19 ENCOUNTER — Ambulatory Visit: Payer: BLUE CROSS/BLUE SHIELD | Admitting: Family Medicine

## 2018-03-05 ENCOUNTER — Other Ambulatory Visit: Payer: Self-pay | Admitting: Family Medicine

## 2018-03-07 ENCOUNTER — Other Ambulatory Visit: Payer: Self-pay | Admitting: Family Medicine

## 2018-03-07 NOTE — Telephone Encounter (Signed)
Copied from CRM [id number]. Topic: Quick Communication - Rx Refill/Question >> Mar 07, 2018 11:20 AM Baldo DaubAlexander, Amber L wrote: Medication: rivaroxaban (XARELTO) 20 MG TABS tablet  Has the patient contacted their pharmacy? Yes - states they have not heard from us.  Pt has been without medication since Sunday (Agent: If no, request that the patient contact the pharmacy for the refill.) (Agent: If yes, when and what did the pharmacy advise?)  Preferred Pharmacy (with phone number or street name): [facility] - [place], [place] - [address] [phone] (Phone) [phone] (Fax)  Agent: Please be advised that RX refills may take up to 3 business days. We ask that you follow-up with your pharmacy.

## 2018-03-08 MED ORDER — RIVAROXABAN 20 MG PO TABS: 20.0000 mg | ORAL_TABLET | Freq: Every day | ORAL | 1 refills | Status: DC

## 2018-03-09 ENCOUNTER — Encounter: Payer: Self-pay | Admitting: Family Medicine

## 2018-03-09 ENCOUNTER — Ambulatory Visit: Payer: BLUE CROSS/BLUE SHIELD | Admitting: Family Medicine

## 2018-03-09 ENCOUNTER — Other Ambulatory Visit: Payer: Self-pay

## 2018-03-09 VITALS — BP 130/81 | HR 83 | Temp 99.7°F | Resp 17 | Ht 65.0 in | Wt 388.5 lb

## 2018-03-09 DIAGNOSIS — I10 Essential (primary) hypertension: Secondary | ICD-10-CM | POA: Diagnosis not present

## 2018-03-09 DIAGNOSIS — I2699 Other pulmonary embolism without acute cor pulmonale: Secondary | ICD-10-CM

## 2018-03-09 DIAGNOSIS — Z23 Encounter for immunization: Secondary | ICD-10-CM

## 2018-03-09 DIAGNOSIS — Z6841 Body Mass Index (BMI) 40.0 and over, adult: Secondary | ICD-10-CM | POA: Diagnosis not present

## 2018-03-09 LAB — CBC WITH DIFFERENTIAL/PLATELET
BASOS ABS: 0 10*3/uL (ref 0.0–0.1)
BASOS PCT: 0.6 % (ref 0.0–3.0)
Eosinophils Absolute: 0.2 10*3/uL (ref 0.0–0.7)
Eosinophils Relative: 4.3 % (ref 0.0–5.0)
HEMATOCRIT: 39.8 % (ref 36.0–46.0)
Hemoglobin: 13.3 g/dL (ref 12.0–15.0)
LYMPHS ABS: 1.9 10*3/uL (ref 0.7–4.0)
LYMPHS PCT: 36.2 % (ref 12.0–46.0)
MCHC: 33.5 g/dL (ref 30.0–36.0)
MCV: 85.6 fl (ref 78.0–100.0)
MONOS PCT: 11.4 % (ref 3.0–12.0)
Monocytes Absolute: 0.6 10*3/uL (ref 0.1–1.0)
NEUTROS ABS: 2.5 10*3/uL (ref 1.4–7.7)
Neutrophils Relative %: 47.5 % (ref 43.0–77.0)
PLATELETS: 417 10*3/uL — AB (ref 150.0–400.0)
RBC: 4.64 Mil/uL (ref 3.87–5.11)
RDW: 17.7 % — AB (ref 11.5–15.5)
WBC: 5.3 10*3/uL (ref 4.0–10.5)

## 2018-03-09 LAB — TSH: TSH: 1.93 u[IU]/mL (ref 0.35–4.50)

## 2018-03-09 LAB — HEPATIC FUNCTION PANEL
ALK PHOS: 68 U/L (ref 39–117)
ALT: 13 U/L (ref 0–35)
AST: 16 U/L (ref 0–37)
Albumin: 3.7 g/dL (ref 3.5–5.2)
BILIRUBIN DIRECT: 0 mg/dL (ref 0.0–0.3)
Total Bilirubin: 0.2 mg/dL (ref 0.2–1.2)
Total Protein: 8 g/dL (ref 6.0–8.3)

## 2018-03-09 LAB — BASIC METABOLIC PANEL
BUN: 15 mg/dL (ref 6–23)
CALCIUM: 9.6 mg/dL (ref 8.4–10.5)
CO2: 28 mEq/L (ref 19–32)
Chloride: 104 mEq/L (ref 96–112)
Creatinine, Ser: 0.76 mg/dL (ref 0.40–1.20)
GFR: 109.33 mL/min (ref >60.00)
GLUCOSE: 95 mg/dL (ref 70–99)
Potassium: 4.1 mEq/L (ref 3.5–5.1)
Sodium: 139 mEq/L (ref 135–145)

## 2018-03-09 LAB — LIPID PANEL
CHOLESTEROL: 190 mg/dL (ref 0–200)
HDL: 42.1 mg/dL (ref >39.00)
LDL CALC: 118 mg/dL — AB (ref 0–99)
NonHDL: 148.07
TRIGLYCERIDES: 151 mg/dL — AB (ref 0.0–149.0)
Total CHOL/HDL Ratio: 5
VLDL: 30.2 mg/dL (ref 0.0–40.0)

## 2018-03-09 MED ORDER — RIVAROXABAN 20 MG PO TABS: 20.0000 mg | ORAL_TABLET | Freq: Every day | ORAL | 1 refills | Status: DC

## 2018-03-09 NOTE — Progress Notes (Signed)
   Subjective:    Patient ID: Taylor Frye, female    DOB: Aug 28, 1980, 37 y.o.   MRN: [id number]  HPI HTN- chronic problem, on Losartan HCTZ 100/12.5mg  daily w/ adequate control.  'I'm feeling good'.  No CP, SOB, HAs, visual changes, abd pain, N/V.  Obesity- ongoing issue for pt.  She has gained 25 lbs since last visit.  No regular exercise since moving to [place] 2 months ago.  Not following a particular diet.  PE- pt reports Xarelto is costing $100/month.  Asking for 90 day supply   Review of Systems For ROS see HPI     Objective:   Physical Exam  Constitutional: She is oriented to person, place, and time. She appears well-developed and well-nourished. No distress.  Morbidly obese  HENT:  Head: Normocephalic and atraumatic.  Eyes: Pupils are equal, round, and reactive to light. Conjunctivae and EOM are normal.  Neck: Normal range of motion. Neck supple. No thyromegaly present.  Cardiovascular: Normal rate, regular rhythm, normal heart sounds and intact distal pulses.  No murmur heard. Pulmonary/Chest: Effort normal and breath sounds normal. No respiratory distress.  Abdominal: Soft. She exhibits no distension. There is no tenderness.  Musculoskeletal: She exhibits no edema.  Lymphadenopathy:    She has no cervical adenopathy.  Neurological: She is alert and oriented to person, place, and time.  Skin: Skin is warm and dry.  Psychiatric: She has a normal mood and affect. Her behavior is normal.  Vitals reviewed.         Assessment & Plan:

## 2018-03-09 NOTE — Assessment & Plan Note (Signed)
Chronic problem.  Adequate control.  Asymptomatic.  Check labs.  No anticipated med changes 

## 2018-03-09 NOTE — Patient Instructions (Addendum)
Schedule your complete physical in 6 months We'll notify you of your lab results and make any changes if needed Continue to work on healthy diet and regular exercise- you can do it! Visit [email or web address] to register for cost savings on the Xarelto Call with any questions or concerns Happy Holidays!

## 2018-03-09 NOTE — Assessment & Plan Note (Signed)
Pt to continue on Xarelto.  Prescription written for 90 day supply.  Pt also given website for savings card

## 2018-03-09 NOTE — Assessment & Plan Note (Signed)
Deteriorated.  Pt has gained 25 lbs.  Stressed need for healthy diet and regular exercise.  Check labs to risk stratify.  Will follow.

## 2018-03-12 ENCOUNTER — Encounter: Payer: Self-pay | Admitting: General Practice

## 2018-03-31 IMAGING — MR MR KNEE*L* W/O CM
4 of 6 series · 20 of 40 positions shown · non-contrast
Comparison: None.

CLINICAL DATA: Left knee pain and weakness, chronic. No known
injury. Initial encounter.

EXAM:
MRI OF THE LEFT KNEE WITHOUT CONTRAST
TECHNIQUE: Multiplanar, multisequence MR imaging of the knee was performed. No
intravenous contrast was administered.

[Series 3: pd_tse_fs_tra · axial · 4.0mm · 0.42mm/px · z∈[-35,+61]mm · 3 of 28 slices shown]
[im 4/28]
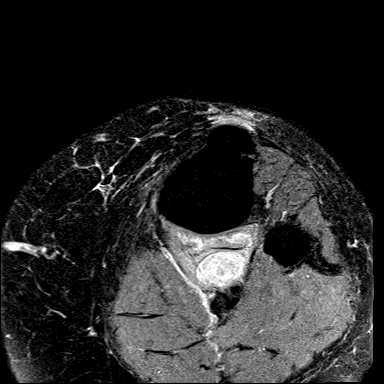
[im 14/28]
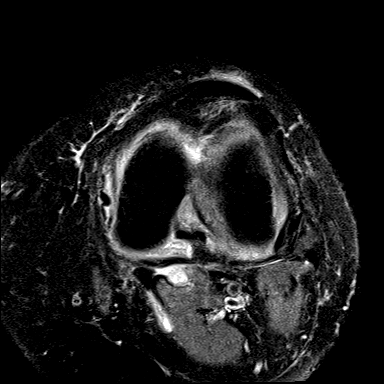
[im 24/28]
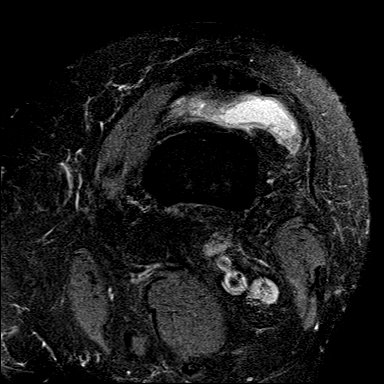

[Series 5: T2 fat-sat · coronal · 3.2mm · 0.62mm/px · 7 of 26 slices shown]
[im 1/26]
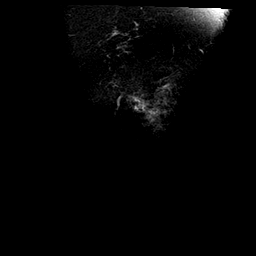
[im 5/26]
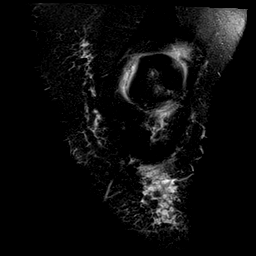
[im 9/26]
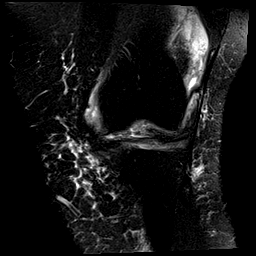
[im 13/26]
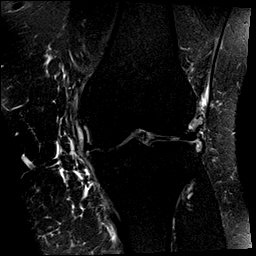
[im 17/26]
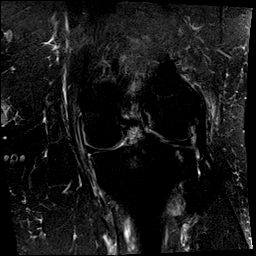
[im 21/26]
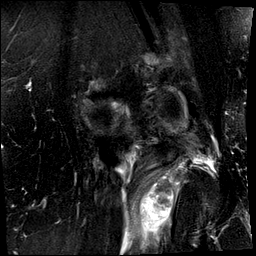
[im 26/26]
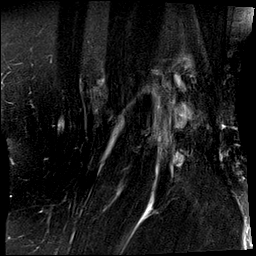

[Series 6: T1 · coronal · 3.2mm · 0.25mm/px · 3 of 26 slices shown]
[im 5/26]
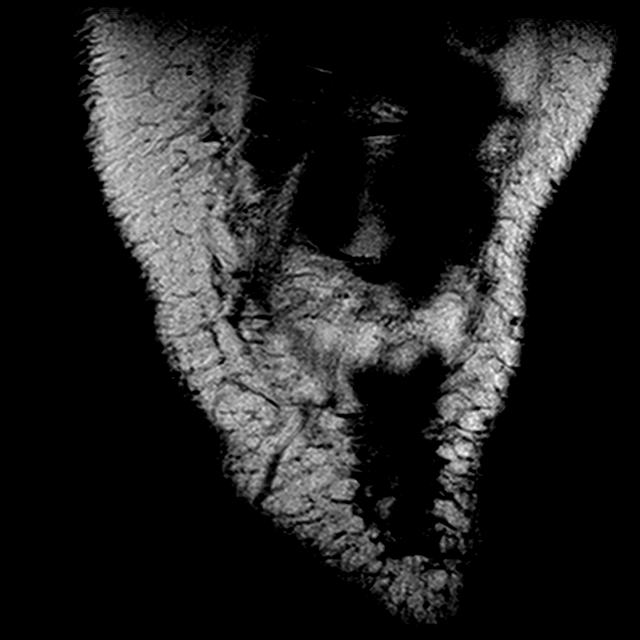
[im 13/26]
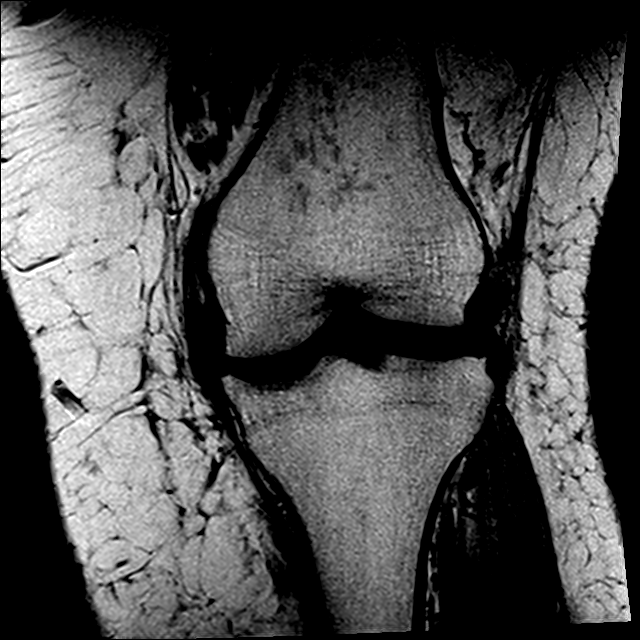
[im 21/26]
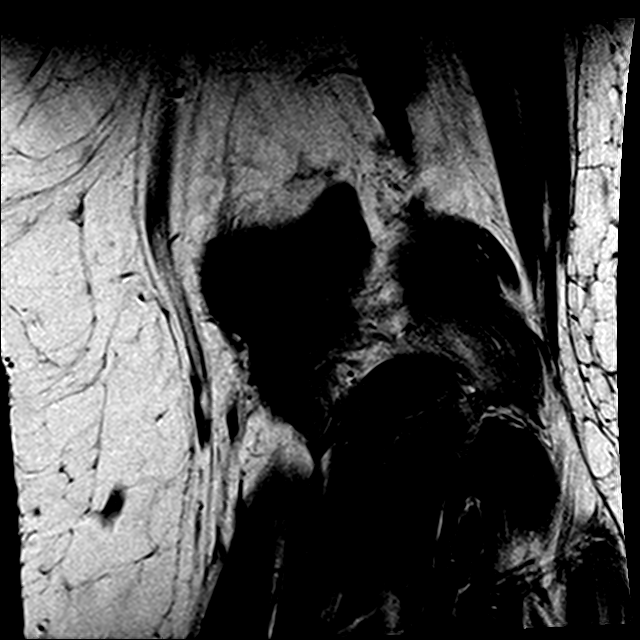

[Series 9: PD fat-sat · sagittal · 3.5mm · 0.28mm/px · 7 of 23 slices shown]
[im 1/23]
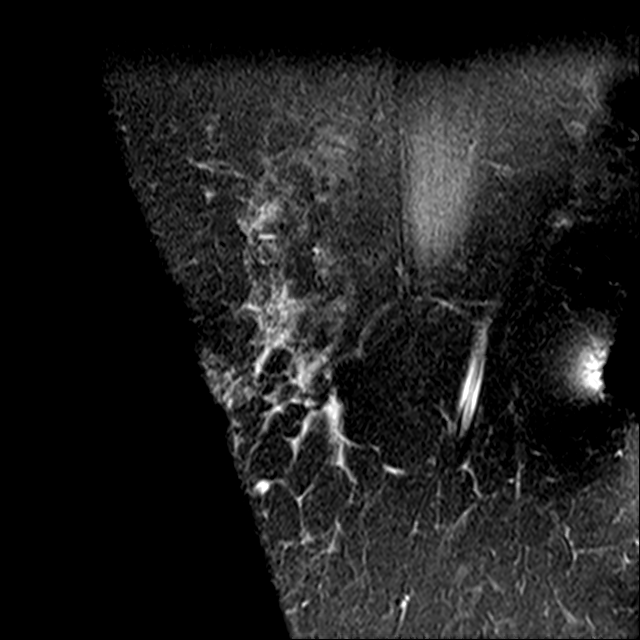
[im 4/23]
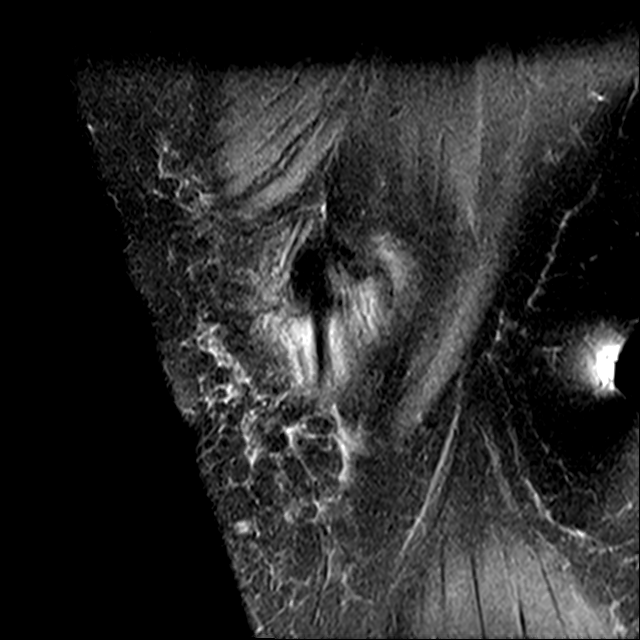
[im 8/23]
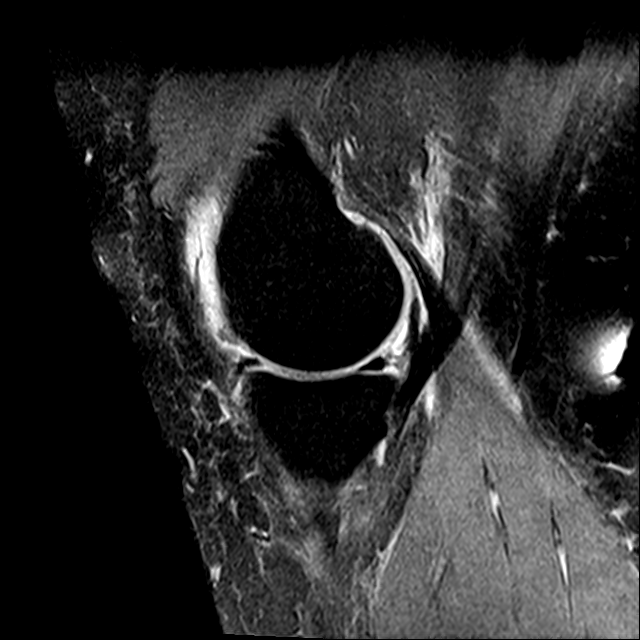
[im 12/23]
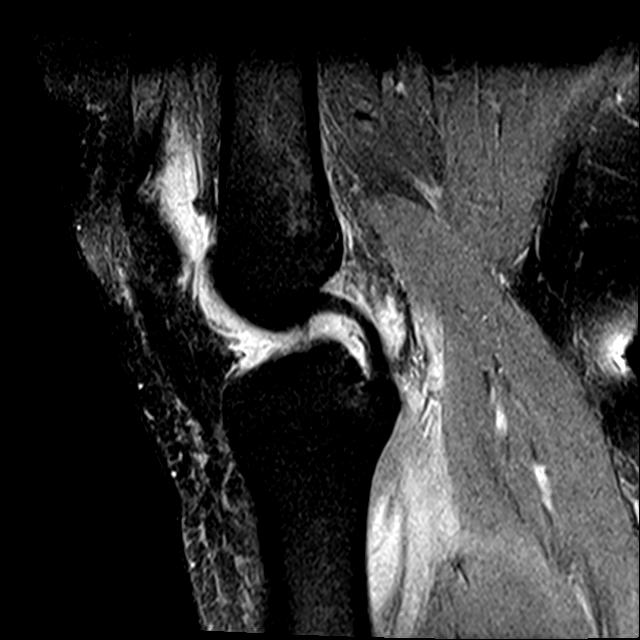
[im 15/23]
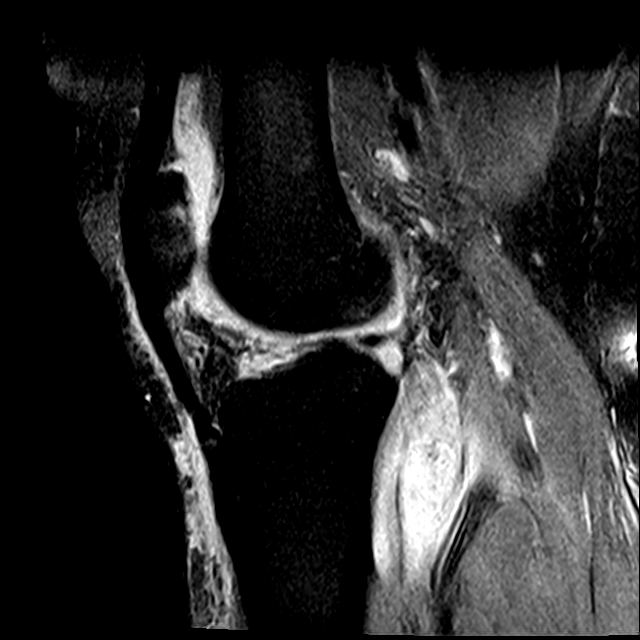
[im 19/23]
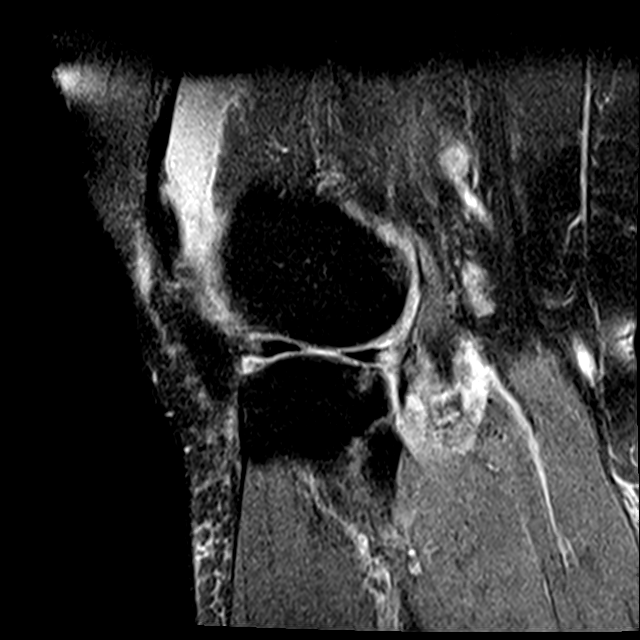
[im 23/23]
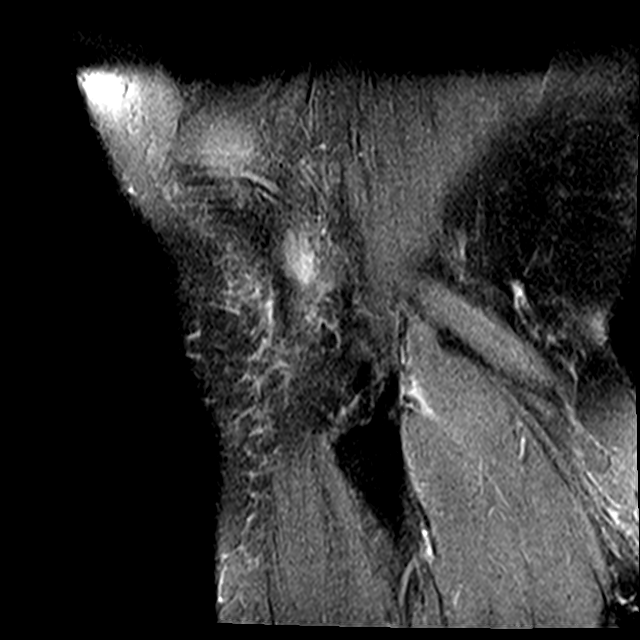

[20 of 40 positions shown; findings below may reference images not displayed]

FINDINGS: MENISCI

Medial meniscus: The body is somewhat diminutive consistent with
degeneration but no tear is identified.

Lateral meniscus: Mild fraying along the free edge of the body is
identified. No tear.

LIGAMENTS

Cruciates: Mucoid degeneration of the ACL without tear is noted. The
PCL is intact.

Collaterals:  Intact.

CARTILAGE

Patellofemoral: Full-thickness cartilage defect is seen in the
lateral patellar facet in the midpole without underlying subchondral
edema.

Medial:  Mildly degenerated.

Lateral:  Mildly degenerated.

Joint: There is a small joint effusion. Synovium about the joint
appears thickened.

Popliteal Fossa: Small Baker's cyst containing some debris is
identified. There is also a mixed signal intensity collection in the
popliteus muscle near the musculotendinous junction. The collection
measures 2.2 cm transverse x 1.3 cm AP x 3.6 cm craniocaudal.

Extensor Mechanism:  Intact.

Bones:  No fracture or worrisome marrow lesion.
IMPRESSION: Negative for meniscal or ligament tear.

Chondromalacia patella.

Findings consistent with synovitis about the joint.

Mixed signal intensity collection the popliteus at the
musculotendinous junction is compatible with a ganglion or
tenosynovitis.

Small Baker's cyst contains debris.

## 2018-06-10 ENCOUNTER — Other Ambulatory Visit: Payer: Self-pay | Admitting: Family Medicine

## 2018-06-16 DIAGNOSIS — J101 Influenza due to other identified influenza virus with other respiratory manifestations: Secondary | ICD-10-CM | POA: Diagnosis not present

## 2018-07-17 ENCOUNTER — Other Ambulatory Visit: Payer: Self-pay

## 2018-07-17 MED ORDER — BECLOMETHASONE DIPROP HFA 80 MCG/ACT IN AERB: 1.0000 | INHALATION_SPRAY | Freq: Two times a day (BID) | RESPIRATORY_TRACT | 3 refills | Status: DC

## 2018-07-23 ENCOUNTER — Encounter: Payer: Self-pay | Admitting: Family Medicine

## 2018-08-02 ENCOUNTER — Encounter: Payer: BLUE CROSS/BLUE SHIELD | Admitting: Family Medicine

## 2018-08-10 ENCOUNTER — Other Ambulatory Visit: Payer: Self-pay | Admitting: Family Medicine

## 2018-09-04 ENCOUNTER — Other Ambulatory Visit: Payer: Self-pay | Admitting: Family Medicine

## 2018-09-04 DIAGNOSIS — M1711 Unilateral primary osteoarthritis, right knee: Secondary | ICD-10-CM | POA: Diagnosis not present

## 2018-09-04 DIAGNOSIS — M1712 Unilateral primary osteoarthritis, left knee: Secondary | ICD-10-CM | POA: Diagnosis not present

## 2018-10-11 ENCOUNTER — Other Ambulatory Visit: Payer: Self-pay | Admitting: Family Medicine

## 2018-10-11 ENCOUNTER — Telehealth: Payer: Self-pay | Admitting: Family Medicine

## 2018-10-11 MED ORDER — QVAR REDIHALER 80 MCG/ACT IN AERB: 1.0000 | INHALATION_SPRAY | Freq: Two times a day (BID) | RESPIRATORY_TRACT | 0 refills | Status: DC

## 2018-10-11 MED ORDER — RIVAROXABAN 20 MG PO TABS: 20.0000 mg | ORAL_TABLET | Freq: Every day | ORAL | 1 refills | Status: DC

## 2018-10-11 NOTE — Telephone Encounter (Signed)
Medication filled to pharmacy as requested.   

## 2018-10-11 NOTE — Telephone Encounter (Signed)
Pt called in stating that all her medications needs to be changed to 90 day supply. She states that she is out of Qvar she needs this one sent in asap. Pt uses [facility] on [address] in [place].

## 2018-10-15 MED ORDER — BECLOMETHASONE DIPROPIONATE 80 MCG/ACT IN AERS: 1.0000 | INHALATION_SPRAY | Freq: Two times a day (BID) | RESPIRATORY_TRACT | 3 refills | Status: DC

## 2018-10-15 NOTE — Telephone Encounter (Signed)
Patient called and said that Pharmacy is still waiting on corrected RX for Qvar.  I see that the note says filled to pharmacy but patient states the pharmacy sent something back after this and they are saying it is not done.    Routing back to CMA to see if she received what it is the pharmacy is asking for.

## 2018-10-15 NOTE — Telephone Encounter (Signed)
Resent in 3 inhalers for pt. This is the 3rd time sending in Rx.

## 2018-10-15 NOTE — Addendum Note (Signed)
Addended by: Davis Gourd on: 10/15/2018 03:31 PM   Modules accepted: Orders

## 2018-11-01 ENCOUNTER — Other Ambulatory Visit: Payer: Self-pay

## 2018-11-01 ENCOUNTER — Ambulatory Visit (INDEPENDENT_AMBULATORY_CARE_PROVIDER_SITE_OTHER): Payer: BC Managed Care – PPO | Admitting: Family Medicine

## 2018-11-01 ENCOUNTER — Encounter: Payer: Self-pay | Admitting: Family Medicine

## 2018-11-01 VITALS — BP 133/80 | HR 78 | Temp 98.0°F | Resp 16 | Ht 65.0 in | Wt >= 6400 oz

## 2018-11-01 DIAGNOSIS — E559 Vitamin D deficiency, unspecified: Secondary | ICD-10-CM | POA: Diagnosis not present

## 2018-11-01 DIAGNOSIS — Z6841 Body Mass Index (BMI) 40.0 and over, adult: Secondary | ICD-10-CM

## 2018-11-01 DIAGNOSIS — Z0000 Encounter for general adult medical examination without abnormal findings: Secondary | ICD-10-CM

## 2018-11-01 DIAGNOSIS — I2699 Other pulmonary embolism without acute cor pulmonale: Secondary | ICD-10-CM | POA: Diagnosis not present

## 2018-11-01 NOTE — Patient Instructions (Signed)
Follow up in 6 months to recheck BP and weight loss progress We'll notify you of your lab results and make any changes if needed We'll call you with your appt for [facility] to get back on track! Call and schedule your pap with your GYN Continue to work on healthy diet and regular exercise- you can do it! Call with any questions or concerns Stay Safe!!!

## 2018-11-01 NOTE — Assessment & Plan Note (Signed)
Chronic problem, on Xarelto.  Asymptomatic at this time.

## 2018-11-01 NOTE — Assessment & Plan Note (Signed)
Pt's PE WNL w/ exception of obesity.  UTD on Tdap.  Pt to call GYN for pap.  Check labs.  Anticipatory guidance provided.

## 2018-11-01 NOTE — Assessment & Plan Note (Signed)
Deteriorated.  Pt has gained 25 lbs since last visit.  Pt admits that she needs 'help'.  The best pt has done was when she was going to [employer].  Will refer back.  Stressed need for healthy diet and regular exercise.  Will follow.

## 2018-11-01 NOTE — Progress Notes (Signed)
   Subjective:    Patient ID: Taylor Frye, female    DOB: Jul 22, 1980, 38 y.o.   MRN: [id number]  HPI CPE- due for pap w/ GYN.  UTD on immunizations.  Pt has gained another 25 lbs.  Working from home.  No regular exercise or activity.  Not following any particular diet right now.  Was previously seeing [facility].  'I don't think I want to do the surgery'.  Pt feels that going back to [facility] will hold her most accountable.     Review of Systems Patient reports no vision/ hearing changes, adenopathy,fever, persistant/recurrent hoarseness , swallowing issues, chest pain, palpitations, edema, persistant/recurrent cough, hemoptysis, dyspnea (rest/exertional/paroxysmal nocturnal), gastrointestinal bleeding (melena, rectal bleeding), abdominal pain, significant heartburn, bowel changes, GU symptoms (dysuria, hematuria, incontinence), Gyn symptoms (abnormal  bleeding, pain),  syncope, focal weakness, memory loss, numbness & tingling, skin/hair/nail changes, abnormal bruising or bleeding, anxiety, or depression.     Objective:   Physical Exam General Appearance:    Alert, cooperative, no distress, appears stated age, morbidly obese  Head:    Normocephalic, without obvious abnormality, atraumatic  Eyes:    PERRL, conjunctiva/corneas clear, EOM's intact, fundi    benign, both eyes  Ears:    Normal TM's and external ear canals, both ears  Nose:   Deferred due to COVID  Throat:   Neck:   Supple, symmetrical, trachea midline, no adenopathy;    Thyroid: no enlargement/tenderness/nodules  Back:     Symmetric, no curvature, ROM normal, no CVA tenderness  Lungs:     Clear to auscultation bilaterally, respirations unlabored  Chest Wall:    No tenderness or deformity   Heart:    Regular rate and rhythm, S1 and S2 normal, no murmur, rub   or gallop  Breast Exam:    Deferred to GYN  Abdomen:     Soft, non-tender, bowel sounds active all four quadrants,    no masses, no organomegaly  Genitalia:     Deferred to GYN  Rectal:    Extremities:   Extremities normal, atraumatic, no cyanosis or edema  Pulses:   2+ and symmetric all extremities  Skin:   Skin color, texture, turgor normal, no rashes or lesions  Lymph nodes:   Cervical, supraclavicular, and axillary nodes normal  Neurologic:   CNII-XII intact, normal strength, sensation and reflexes    throughout          Assessment & Plan:

## 2018-11-02 LAB — CBC WITH DIFFERENTIAL/PLATELET
Basophils Absolute: 0 10*3/uL (ref 0.0–0.1)
Basophils Relative: 0.7 % (ref 0.0–3.0)
Eosinophils Absolute: 0.1 10*3/uL (ref 0.0–0.7)
Eosinophils Relative: 2.2 % (ref 0.0–5.0)
HCT: 38 % (ref 36.0–46.0)
Hemoglobin: 12.6 g/dL (ref 12.0–15.0)
Lymphocytes Relative: 31.9 % (ref 12.0–46.0)
Lymphs Abs: 1.9 10*3/uL (ref 0.7–4.0)
MCHC: 33.2 g/dL (ref 30.0–36.0)
MCV: 87.9 fl (ref 78.0–100.0)
Monocytes Absolute: 0.7 10*3/uL (ref 0.1–1.0)
Monocytes Relative: 12.4 % — ABNORMAL HIGH (ref 3.0–12.0)
Neutro Abs: 3.1 10*3/uL (ref 1.4–7.7)
Neutrophils Relative %: 52.8 % (ref 43.0–77.0)
Platelets: 464 10*3/uL — ABNORMAL HIGH (ref 150.0–400.0)
RBC: 4.32 Mil/uL (ref 3.87–5.11)
RDW: 15.1 % (ref 11.5–15.5)
WBC: 5.9 10*3/uL (ref 4.0–10.5)

## 2018-11-02 LAB — LIPID PANEL
Cholesterol: 183 mg/dL (ref 0–200)
HDL: 39 mg/dL — ABNORMAL LOW (ref >39.00)
LDL Cholesterol: 114 mg/dL — ABNORMAL HIGH (ref 0–99)
NonHDL: 143.5
Total CHOL/HDL Ratio: 5
Triglycerides: 147 mg/dL (ref 0.0–149.0)
VLDL: 29.4 mg/dL (ref 0.0–40.0)

## 2018-11-02 LAB — HEMOGLOBIN A1C: Hgb A1c MFr Bld: 6.2 % (ref 4.6–6.5)

## 2018-11-02 LAB — HEPATIC FUNCTION PANEL
ALT: 13 U/L (ref 0–35)
AST: 13 U/L (ref 0–37)
Albumin: 3.7 g/dL (ref 3.5–5.2)
Alkaline Phosphatase: 62 U/L (ref 39–117)
Bilirubin, Direct: 0.1 mg/dL (ref 0.0–0.3)
Total Bilirubin: 0.3 mg/dL (ref 0.2–1.2)
Total Protein: 7.5 g/dL (ref 6.0–8.3)

## 2018-11-02 LAB — BASIC METABOLIC PANEL
BUN: 19 mg/dL (ref 6–23)
CO2: 29 mEq/L (ref 19–32)
Calcium: 9.3 mg/dL (ref 8.4–10.5)
Chloride: 103 mEq/L (ref 96–112)
Creatinine, Ser: 0.76 mg/dL (ref 0.40–1.20)
GFR: 102.52 mL/min (ref >60.00)
Glucose, Bld: 103 mg/dL — ABNORMAL HIGH (ref 70–99)
Potassium: 4.3 mEq/L (ref 3.5–5.1)
Sodium: 138 mEq/L (ref 135–145)

## 2018-11-02 LAB — TSH: TSH: 2.02 u[IU]/mL (ref 0.35–4.50)

## 2018-11-02 LAB — VITAMIN D 25 HYDROXY (VIT D DEFICIENCY, FRACTURES): VITD: 26.53 ng/mL — ABNORMAL LOW (ref 30.00–100.00)

## 2018-11-06 ENCOUNTER — Other Ambulatory Visit: Payer: Self-pay | Admitting: General Practice

## 2018-11-06 ENCOUNTER — Encounter: Payer: Self-pay | Admitting: Family Medicine

## 2018-11-06 MED ORDER — VITAMIN D (ERGOCALCIFEROL) 1.25 MG (50000 UNIT) PO CAPS: 50000.0000 [IU] | ORAL_CAPSULE | ORAL | 0 refills | Status: DC

## 2018-11-12 ENCOUNTER — Other Ambulatory Visit: Payer: Self-pay | Admitting: Family Medicine

## 2018-12-04 DIAGNOSIS — Z6841 Body Mass Index (BMI) 40.0 and over, adult: Secondary | ICD-10-CM | POA: Diagnosis not present

## 2018-12-04 DIAGNOSIS — I10 Essential (primary) hypertension: Secondary | ICD-10-CM | POA: Diagnosis not present

## 2018-12-04 DIAGNOSIS — G4733 Obstructive sleep apnea (adult) (pediatric): Secondary | ICD-10-CM | POA: Diagnosis not present

## 2018-12-05 ENCOUNTER — Other Ambulatory Visit: Payer: Self-pay | Admitting: Family Medicine

## 2019-01-16 ENCOUNTER — Encounter: Payer: Self-pay | Admitting: Family Medicine

## 2019-02-11 DIAGNOSIS — R7303 Prediabetes: Secondary | ICD-10-CM | POA: Diagnosis not present

## 2019-02-11 DIAGNOSIS — G4733 Obstructive sleep apnea (adult) (pediatric): Secondary | ICD-10-CM | POA: Diagnosis not present

## 2019-02-11 DIAGNOSIS — Z6841 Body Mass Index (BMI) 40.0 and over, adult: Secondary | ICD-10-CM | POA: Diagnosis not present

## 2019-02-13 DIAGNOSIS — Z6841 Body Mass Index (BMI) 40.0 and over, adult: Secondary | ICD-10-CM | POA: Diagnosis not present

## 2019-02-13 DIAGNOSIS — Z713 Dietary counseling and surveillance: Secondary | ICD-10-CM | POA: Diagnosis not present

## 2019-02-20 ENCOUNTER — Other Ambulatory Visit: Payer: Self-pay | Admitting: Family Medicine

## 2019-03-05 DIAGNOSIS — M1711 Unilateral primary osteoarthritis, right knee: Secondary | ICD-10-CM | POA: Diagnosis not present

## 2019-03-05 DIAGNOSIS — M1712 Unilateral primary osteoarthritis, left knee: Secondary | ICD-10-CM | POA: Diagnosis not present

## 2019-03-06 ENCOUNTER — Other Ambulatory Visit: Payer: Self-pay | Admitting: Family Medicine

## 2019-03-10 ENCOUNTER — Other Ambulatory Visit: Payer: Self-pay | Admitting: Family Medicine

## 2019-03-19 DIAGNOSIS — Z7901 Long term (current) use of anticoagulants: Secondary | ICD-10-CM | POA: Diagnosis not present

## 2019-03-19 DIAGNOSIS — R509 Fever, unspecified: Secondary | ICD-10-CM | POA: Diagnosis not present

## 2019-03-19 DIAGNOSIS — Z79899 Other long term (current) drug therapy: Secondary | ICD-10-CM | POA: Diagnosis not present

## 2019-03-19 DIAGNOSIS — N390 Urinary tract infection, site not specified: Secondary | ICD-10-CM | POA: Diagnosis not present

## 2019-03-19 DIAGNOSIS — R350 Frequency of micturition: Secondary | ICD-10-CM | POA: Diagnosis not present

## 2019-04-24 DIAGNOSIS — Z03818 Encounter for observation for suspected exposure to other biological agents ruled out: Secondary | ICD-10-CM | POA: Diagnosis not present

## 2019-04-24 DIAGNOSIS — Z20828 Contact with and (suspected) exposure to other viral communicable diseases: Secondary | ICD-10-CM | POA: Diagnosis not present

## 2019-04-30 ENCOUNTER — Ambulatory Visit: Payer: BC Managed Care – PPO | Admitting: Family Medicine

## 2019-05-04 ENCOUNTER — Encounter: Payer: Self-pay | Admitting: Family Medicine

## 2019-05-17 ENCOUNTER — Ambulatory Visit (INDEPENDENT_AMBULATORY_CARE_PROVIDER_SITE_OTHER): Payer: BC Managed Care – PPO | Admitting: Family Medicine

## 2019-05-17 ENCOUNTER — Other Ambulatory Visit: Payer: Self-pay

## 2019-05-17 ENCOUNTER — Encounter: Payer: Self-pay | Admitting: Family Medicine

## 2019-05-17 DIAGNOSIS — Z6841 Body Mass Index (BMI) 40.0 and over, adult: Secondary | ICD-10-CM

## 2019-05-17 DIAGNOSIS — I10 Essential (primary) hypertension: Secondary | ICD-10-CM

## 2019-05-17 NOTE — Progress Notes (Signed)
   Virtual Visit via Video   I connected with Taylor Frye on 05/17/19 at  3:00 PM EST by a video enabled telemedicine application and verified that I am speaking with the correct person using two identifiers.  Location Taylor Frye: Home Location provider: [facility], Office Persons participating in the virtual visit: Taylor Frye, Provider, CMA (Jess B)  I discussed the limitations of evaluation and management by telemedicine and the availability of in person appointments. The Taylor Frye expressed understanding and agreed to proceed.  Subjective:   HPI:   HTN- chronic problem, on HCTZ 12.5mg  daily, Losartan 100mg  daily.  No CP, SOB, HAs, visual changes, edema, abd pain, N/V.  Obesity- chronic problem, last recorded weight 414 lbs.  No regular exercise.  Following w/ Bariatric Clinic and trying to avoid sweets and follow a low carb diet.  Pt is on Qsymia.  Had initially lost 15 lbs but then plateaued.    ROS:   See pertinent positives and negatives per HPI.  Taylor Frye Active Problem List   Diagnosis Date Noted  . Vitamin D deficiency 07/31/2017  . Bilateral pulmonary embolism (HCC) 05/22/2017  . Amenorrhea 02/12/2014  . HTN (hypertension) 01/15/2014  . Low back pain 08/09/2012  . Edema 08/09/2012  . Morbid obesity with BMI of 60.0-69.9, adult (HCC)   . Seasonal allergies   . PCOS (polycystic ovarian syndrome)   . Oligomenorrhea   . HSV-2 infection   . Anovulation   . Ovarian cyst   . Knee pain 12/06/2011  . General medical examination 09/27/2010  . RHINITIS 08/08/2008  . ASTHMA 08/08/2008    Social History   Tobacco Use  . Smoking status: Never Smoker  . Smokeless tobacco: Never Used  Substance Use Topics  . Alcohol use: Yes    Comment: occ.    Current Outpatient Medications:  .  albuterol (PROVENTIL HFA;VENTOLIN HFA) 108 (90 Base) MCG/ACT inhaler, INHALE 2 PUFFS INTO LUNGS EVERY 4 HOURS AS NEEDED, Disp: 9 each, Rfl: 6 .  beclomethasone (QVAR) 80 MCG/ACT inhaler, Inhale  1 puff into the lungs 2 (two) times a day., Disp: 3 Inhaler, Rfl: 3 .  hydrochlorothiazide (HYDRODIURIL) 12.5 MG tablet, Take 1 tablet by mouth once daily, Disp: 90 tablet, Rfl: 0 .  losartan (COZAAR) 100 MG tablet, Take 1 tablet by mouth once daily, Disp: 90 tablet, Rfl: 0 .  montelukast (SINGULAIR) 10 MG tablet, TAKE 1 TABLET BY MOUTH AT BEDTIME, Disp: 90 tablet, Rfl: 0 .  rivaroxaban (XARELTO) 20 MG TABS tablet, Take 1 tablet (20 mg total) by mouth daily with supper., Disp: 90 tablet, Rfl: 1  No Known Allergies  Objective:   There were no vitals taken for this visit. AAOx3, NAD Obese NCAT, EOMI No obvious CN deficits Coloring WNL Pt is able to speak clearly, coherently without shortness of breath or increased work of breathing.  Thought process is linear.  Mood is appropriate.   Assessment and Plan:   HTN- chronic problem.  Pt is currently asymptomatic but not able to check BP at home.  Tolerating HCTZ and Losartan w/o difficulty.  Will check labs and BP when she comes in.  Obesity- ongoing issue.  Following w/ Bariatric clinic.  Not getting any regular exercise.  Very little activity at all.  Applauded her efforts at low carb diet.  Will follow.   08/10/2008, MD 05/17/2019

## 2019-05-17 NOTE — Progress Notes (Signed)
I have discussed the procedure for the virtual visit with the patient who has given consent to proceed with assessment and treatment.   Pap is scheduled for Thursday.  Pt unable to obtain vitals.   Geannie Risen, CMA

## 2019-05-21 ENCOUNTER — Other Ambulatory Visit: Payer: Self-pay

## 2019-05-21 ENCOUNTER — Ambulatory Visit (INDEPENDENT_AMBULATORY_CARE_PROVIDER_SITE_OTHER): Payer: BC Managed Care – PPO

## 2019-05-21 DIAGNOSIS — Z6841 Body Mass Index (BMI) 40.0 and over, adult: Secondary | ICD-10-CM | POA: Diagnosis not present

## 2019-05-21 DIAGNOSIS — I10 Essential (primary) hypertension: Secondary | ICD-10-CM | POA: Diagnosis not present

## 2019-05-21 LAB — HEPATIC FUNCTION PANEL
ALT: 12 U/L (ref 0–35)
AST: 14 U/L (ref 0–37)
Albumin: 3.8 g/dL (ref 3.5–5.2)
Alkaline Phosphatase: 69 U/L (ref 39–117)
Bilirubin, Direct: 0.1 mg/dL (ref 0.0–0.3)
Total Bilirubin: 0.3 mg/dL (ref 0.2–1.2)
Total Protein: 8 g/dL (ref 6.0–8.3)

## 2019-05-21 LAB — CBC WITH DIFFERENTIAL/PLATELET
Basophils Absolute: 0 10*3/uL (ref 0.0–0.1)
Basophils Relative: 0.5 % (ref 0.0–3.0)
Eosinophils Absolute: 0.1 10*3/uL (ref 0.0–0.7)
Eosinophils Relative: 1.6 % (ref 0.0–5.0)
HCT: 39.9 % (ref 36.0–46.0)
Hemoglobin: 13.2 g/dL (ref 12.0–15.0)
Lymphocytes Relative: 30.2 % (ref 12.0–46.0)
Lymphs Abs: 1.8 10*3/uL (ref 0.7–4.0)
MCHC: 33.1 g/dL (ref 30.0–36.0)
MCV: 86.5 fl (ref 78.0–100.0)
Monocytes Absolute: 0.6 10*3/uL (ref 0.1–1.0)
Monocytes Relative: 10.1 % (ref 3.0–12.0)
Neutro Abs: 3.5 10*3/uL (ref 1.4–7.7)
Neutrophils Relative %: 57.6 % (ref 43.0–77.0)
Platelets: 447 10*3/uL — ABNORMAL HIGH (ref 150.0–400.0)
RBC: 4.61 Mil/uL (ref 3.87–5.11)
RDW: 16.6 % — ABNORMAL HIGH (ref 11.5–15.5)
WBC: 6 10*3/uL (ref 4.0–10.5)

## 2019-05-21 LAB — BASIC METABOLIC PANEL
BUN: 17 mg/dL (ref 6–23)
CO2: 25 mEq/L (ref 19–32)
Calcium: 9.5 mg/dL (ref 8.4–10.5)
Chloride: 104 mEq/L (ref 96–112)
Creatinine, Ser: 0.86 mg/dL (ref 0.40–1.20)
GFR: 88.64 mL/min (ref >60.00)
Glucose, Bld: 116 mg/dL — ABNORMAL HIGH (ref 70–99)
Potassium: 4 mEq/L (ref 3.5–5.1)
Sodium: 140 mEq/L (ref 135–145)

## 2019-05-21 LAB — HEMOGLOBIN A1C: Hgb A1c MFr Bld: 6.3 % (ref 4.6–6.5)

## 2019-05-21 LAB — TSH: TSH: 2.18 u[IU]/mL (ref 0.35–4.50)

## 2019-05-21 LAB — LIPID PANEL
Cholesterol: 190 mg/dL (ref 0–200)
HDL: 41.2 mg/dL (ref >39.00)
LDL Cholesterol: 121 mg/dL — ABNORMAL HIGH (ref 0–99)
NonHDL: 148.67
Total CHOL/HDL Ratio: 5
Triglycerides: 139 mg/dL (ref 0.0–149.0)
VLDL: 27.8 mg/dL (ref 0.0–40.0)

## 2019-05-22 ENCOUNTER — Ambulatory Visit: Payer: BC Managed Care – PPO

## 2019-05-23 DIAGNOSIS — Z01411 Encounter for gynecological examination (general) (routine) with abnormal findings: Secondary | ICD-10-CM | POA: Diagnosis not present

## 2019-05-23 DIAGNOSIS — Z6841 Body Mass Index (BMI) 40.0 and over, adult: Secondary | ICD-10-CM | POA: Diagnosis not present

## 2019-05-23 DIAGNOSIS — Z113 Encounter for screening for infections with a predominantly sexual mode of transmission: Secondary | ICD-10-CM | POA: Diagnosis not present

## 2019-05-23 DIAGNOSIS — E282 Polycystic ovarian syndrome: Secondary | ICD-10-CM | POA: Diagnosis not present

## 2019-05-23 DIAGNOSIS — N926 Irregular menstruation, unspecified: Secondary | ICD-10-CM | POA: Diagnosis not present

## 2019-05-23 DIAGNOSIS — Z124 Encounter for screening for malignant neoplasm of cervix: Secondary | ICD-10-CM | POA: Diagnosis not present

## 2019-05-26 LAB — HM PAP SMEAR

## 2019-05-28 ENCOUNTER — Other Ambulatory Visit: Payer: Self-pay | Admitting: Family Medicine

## 2019-06-07 ENCOUNTER — Other Ambulatory Visit: Payer: Self-pay | Admitting: Family Medicine

## 2019-06-12 ENCOUNTER — Other Ambulatory Visit: Payer: Self-pay | Admitting: Family Medicine

## 2019-06-13 NOTE — Telephone Encounter (Signed)
Please advise. It doesn't appear as though pt is taking this every day.

## 2019-06-19 DIAGNOSIS — Z6841 Body Mass Index (BMI) 40.0 and over, adult: Secondary | ICD-10-CM | POA: Diagnosis not present

## 2019-06-19 DIAGNOSIS — G8929 Other chronic pain: Secondary | ICD-10-CM | POA: Diagnosis not present

## 2019-06-19 DIAGNOSIS — I10 Essential (primary) hypertension: Secondary | ICD-10-CM | POA: Diagnosis not present

## 2019-06-19 DIAGNOSIS — M25561 Pain in right knee: Secondary | ICD-10-CM | POA: Diagnosis not present

## 2019-06-19 DIAGNOSIS — M25562 Pain in left knee: Secondary | ICD-10-CM | POA: Diagnosis not present

## 2019-06-28 ENCOUNTER — Encounter: Payer: Self-pay | Admitting: General Practice

## 2019-07-04 DIAGNOSIS — M1711 Unilateral primary osteoarthritis, right knee: Secondary | ICD-10-CM | POA: Diagnosis not present

## 2019-07-04 DIAGNOSIS — M1712 Unilateral primary osteoarthritis, left knee: Secondary | ICD-10-CM | POA: Diagnosis not present

## 2019-07-10 DIAGNOSIS — R29898 Other symptoms and signs involving the musculoskeletal system: Secondary | ICD-10-CM | POA: Diagnosis not present

## 2019-07-10 DIAGNOSIS — G8929 Other chronic pain: Secondary | ICD-10-CM | POA: Diagnosis not present

## 2019-07-10 DIAGNOSIS — M25562 Pain in left knee: Secondary | ICD-10-CM | POA: Diagnosis not present

## 2019-07-10 DIAGNOSIS — M25561 Pain in right knee: Secondary | ICD-10-CM | POA: Diagnosis not present

## 2019-07-18 ENCOUNTER — Ambulatory Visit: Payer: BC Managed Care – PPO | Attending: Internal Medicine

## 2019-07-18 DIAGNOSIS — Z23 Encounter for immunization: Secondary | ICD-10-CM

## 2019-07-19 ENCOUNTER — Other Ambulatory Visit: Payer: Self-pay | Admitting: Family Medicine

## 2019-07-22 ENCOUNTER — Telehealth: Payer: Self-pay | Admitting: General Practice

## 2019-07-22 MED ORDER — ALBUTEROL SULFATE HFA 108 (90 BASE) MCG/ACT IN AERS: 2.0000 | INHALATION_SPRAY | Freq: Four times a day (QID) | RESPIRATORY_TRACT | 1 refills | Status: DC | PRN

## 2019-07-22 NOTE — Telephone Encounter (Signed)
Per pt insurance Proair inhaler is no longer covered.   Preferred are:  Plain Albuterol (HFA) Levalbuterol tartrate ProAir respliclick Xopenex HFA

## 2019-07-22 NOTE — Telephone Encounter (Signed)
Ok to send Albuterol HFA in place of Proair

## 2019-07-22 NOTE — Telephone Encounter (Signed)
New Rx sent to pharmacy

## 2019-07-23 DIAGNOSIS — G8929 Other chronic pain: Secondary | ICD-10-CM | POA: Diagnosis not present

## 2019-07-23 DIAGNOSIS — M25561 Pain in right knee: Secondary | ICD-10-CM | POA: Diagnosis not present

## 2019-07-23 DIAGNOSIS — M25562 Pain in left knee: Secondary | ICD-10-CM | POA: Diagnosis not present

## 2019-08-06 DIAGNOSIS — M25562 Pain in left knee: Secondary | ICD-10-CM | POA: Diagnosis not present

## 2019-08-06 DIAGNOSIS — M25561 Pain in right knee: Secondary | ICD-10-CM | POA: Diagnosis not present

## 2019-08-06 DIAGNOSIS — G8929 Other chronic pain: Secondary | ICD-10-CM | POA: Diagnosis not present

## 2019-08-12 ENCOUNTER — Ambulatory Visit: Payer: BC Managed Care – PPO | Attending: Internal Medicine

## 2019-08-12 DIAGNOSIS — Z23 Encounter for immunization: Secondary | ICD-10-CM

## 2019-08-12 NOTE — Progress Notes (Signed)
   Covid-19 Vaccination Clinic  Name:  Taylor Frye    MRN: [id number] DOB: July 10, 1980  08/12/2019  [name] was observed post Covid-19 immunization for 15 minutes without incident. She was provided with Vaccine Information Sheet and instruction to access the V-Safe system.   [name] was instructed to call 911 with any severe reactions post vaccine: Marland Kitchen Difficulty breathing  . Swelling of face and throat  . A fast heartbeat  . A bad rash all over body  . Dizziness and weakness   Immunizations Administered    Name Date Dose VIS Date Route   Pfizer COVID-19 Vaccine 08/12/2019  9:53 AM 0.3 mL 06/12/2018 Intramuscular   Manufacturer: [employer], [employer]   Lot: [id number]   NDC: [address]-3

## 2019-08-19 DIAGNOSIS — N926 Irregular menstruation, unspecified: Secondary | ICD-10-CM | POA: Diagnosis not present

## 2019-08-19 DIAGNOSIS — I10 Essential (primary) hypertension: Secondary | ICD-10-CM | POA: Diagnosis not present

## 2019-08-19 DIAGNOSIS — R634 Abnormal weight loss: Secondary | ICD-10-CM | POA: Diagnosis not present

## 2019-08-19 DIAGNOSIS — R7303 Prediabetes: Secondary | ICD-10-CM | POA: Diagnosis not present

## 2019-08-19 DIAGNOSIS — E559 Vitamin D deficiency, unspecified: Secondary | ICD-10-CM | POA: Diagnosis not present

## 2019-08-20 DIAGNOSIS — M25561 Pain in right knee: Secondary | ICD-10-CM | POA: Diagnosis not present

## 2019-08-20 DIAGNOSIS — G8929 Other chronic pain: Secondary | ICD-10-CM | POA: Diagnosis not present

## 2019-08-20 DIAGNOSIS — M25562 Pain in left knee: Secondary | ICD-10-CM | POA: Diagnosis not present

## 2019-08-21 ENCOUNTER — Other Ambulatory Visit: Payer: Self-pay | Admitting: Family Medicine

## 2019-08-28 DIAGNOSIS — I10 Essential (primary) hypertension: Secondary | ICD-10-CM | POA: Diagnosis not present

## 2019-08-28 DIAGNOSIS — Z6841 Body Mass Index (BMI) 40.0 and over, adult: Secondary | ICD-10-CM | POA: Diagnosis not present

## 2019-08-28 DIAGNOSIS — G8929 Other chronic pain: Secondary | ICD-10-CM | POA: Diagnosis not present

## 2019-08-28 DIAGNOSIS — M25561 Pain in right knee: Secondary | ICD-10-CM | POA: Diagnosis not present

## 2019-08-28 DIAGNOSIS — M170 Bilateral primary osteoarthritis of knee: Secondary | ICD-10-CM | POA: Diagnosis not present

## 2019-08-31 DIAGNOSIS — M170 Bilateral primary osteoarthritis of knee: Secondary | ICD-10-CM | POA: Insufficient documentation

## 2019-09-03 DIAGNOSIS — E282 Polycystic ovarian syndrome: Secondary | ICD-10-CM | POA: Diagnosis not present

## 2019-09-03 DIAGNOSIS — Z6841 Body Mass Index (BMI) 40.0 and over, adult: Secondary | ICD-10-CM | POA: Diagnosis not present

## 2019-09-03 DIAGNOSIS — Z713 Dietary counseling and surveillance: Secondary | ICD-10-CM | POA: Diagnosis not present

## 2019-09-06 ENCOUNTER — Other Ambulatory Visit: Payer: Self-pay | Admitting: Family Medicine

## 2019-09-10 DIAGNOSIS — M25562 Pain in left knee: Secondary | ICD-10-CM | POA: Diagnosis not present

## 2019-09-10 DIAGNOSIS — M25561 Pain in right knee: Secondary | ICD-10-CM | POA: Diagnosis not present

## 2019-09-10 DIAGNOSIS — G8929 Other chronic pain: Secondary | ICD-10-CM | POA: Diagnosis not present

## 2019-09-29 ENCOUNTER — Other Ambulatory Visit: Payer: Self-pay | Admitting: Family Medicine

## 2019-10-15 DIAGNOSIS — R079 Chest pain, unspecified: Secondary | ICD-10-CM | POA: Diagnosis not present

## 2019-10-15 DIAGNOSIS — I10 Essential (primary) hypertension: Secondary | ICD-10-CM | POA: Diagnosis not present

## 2019-10-15 DIAGNOSIS — J45909 Unspecified asthma, uncomplicated: Secondary | ICD-10-CM | POA: Diagnosis not present

## 2019-10-15 DIAGNOSIS — R002 Palpitations: Secondary | ICD-10-CM | POA: Diagnosis not present

## 2019-10-15 DIAGNOSIS — R7989 Other specified abnormal findings of blood chemistry: Secondary | ICD-10-CM | POA: Diagnosis not present

## 2019-10-15 DIAGNOSIS — M1711 Unilateral primary osteoarthritis, right knee: Secondary | ICD-10-CM | POA: Diagnosis not present

## 2019-10-15 DIAGNOSIS — M1712 Unilateral primary osteoarthritis, left knee: Secondary | ICD-10-CM | POA: Diagnosis not present

## 2019-10-15 DIAGNOSIS — Z7901 Long term (current) use of anticoagulants: Secondary | ICD-10-CM | POA: Diagnosis not present

## 2019-10-15 DIAGNOSIS — Z86711 Personal history of pulmonary embolism: Secondary | ICD-10-CM | POA: Diagnosis not present

## 2019-10-15 DIAGNOSIS — R000 Tachycardia, unspecified: Secondary | ICD-10-CM | POA: Diagnosis not present

## 2019-10-15 DIAGNOSIS — Z79899 Other long term (current) drug therapy: Secondary | ICD-10-CM | POA: Diagnosis not present

## 2019-11-01 ENCOUNTER — Other Ambulatory Visit: Payer: Self-pay | Admitting: Family Medicine

## 2019-11-29 ENCOUNTER — Encounter: Payer: Self-pay | Admitting: Family Medicine

## 2019-12-02 ENCOUNTER — Other Ambulatory Visit: Payer: Self-pay | Admitting: General Practice

## 2019-12-02 MED ORDER — QVAR REDIHALER 80 MCG/ACT IN AERB: 1.0000 | INHALATION_SPRAY | Freq: Two times a day (BID) | RESPIRATORY_TRACT | 0 refills | Status: DC

## 2019-12-09 ENCOUNTER — Other Ambulatory Visit: Payer: Self-pay | Admitting: Family Medicine

## 2019-12-10 MED ORDER — MONTELUKAST SODIUM 10 MG PO TABS
10.0000 mg | ORAL_TABLET | Freq: Every day | ORAL | 0 refills | Status: DC
Start: 2019-12-10 — End: 2020-04-03

## 2019-12-10 MED ORDER — LOSARTAN POTASSIUM 100 MG PO TABS
100.0000 mg | ORAL_TABLET | Freq: Every day | ORAL | 0 refills | Status: DC
Start: 2019-12-10 — End: 2020-03-09

## 2019-12-10 MED ORDER — QVAR REDIHALER 80 MCG/ACT IN AERB: 1.0000 | INHALATION_SPRAY | Freq: Two times a day (BID) | RESPIRATORY_TRACT | 1 refills | Status: DC

## 2019-12-10 MED ORDER — HYDROCHLOROTHIAZIDE 12.5 MG PO TABS
12.5000 mg | ORAL_TABLET | Freq: Every day | ORAL | 0 refills | Status: DC
Start: 2019-12-10 — End: 2020-03-09

## 2019-12-13 DIAGNOSIS — M170 Bilateral primary osteoarthritis of knee: Secondary | ICD-10-CM | POA: Diagnosis not present

## 2019-12-25 ENCOUNTER — Ambulatory Visit: Payer: BC Managed Care – PPO | Admitting: Family Medicine

## 2019-12-31 ENCOUNTER — Encounter: Payer: Self-pay | Admitting: Family Medicine

## 2019-12-31 ENCOUNTER — Telehealth (INDEPENDENT_AMBULATORY_CARE_PROVIDER_SITE_OTHER): Payer: BC Managed Care – PPO | Admitting: Family Medicine

## 2019-12-31 ENCOUNTER — Other Ambulatory Visit: Payer: Self-pay

## 2019-12-31 VITALS — BP 122/76 | HR 94 | Ht 65.0 in | Wt 387.1 lb

## 2019-12-31 DIAGNOSIS — Z6841 Body Mass Index (BMI) 40.0 and over, adult: Secondary | ICD-10-CM | POA: Diagnosis not present

## 2019-12-31 DIAGNOSIS — J4540 Moderate persistent asthma, uncomplicated: Secondary | ICD-10-CM | POA: Diagnosis not present

## 2019-12-31 DIAGNOSIS — I2699 Other pulmonary embolism without acute cor pulmonale: Secondary | ICD-10-CM | POA: Diagnosis not present

## 2019-12-31 MED ORDER — RIVAROXABAN 20 MG PO TABS
ORAL_TABLET | ORAL | 0 refills | Status: DC
Start: 2019-12-31 — End: 2020-05-18

## 2019-12-31 NOTE — Progress Notes (Signed)
I have discussed the procedure for the virtual visit with the patient who has given consent to proceed with assessment and treatment.   Darshawn Boateng L Drexel Ivey, CMA     

## 2019-12-31 NOTE — Progress Notes (Signed)
Virtual Visit via Video   I connected with patient on 12/31/19 at  1:00 PM EDT by a video enabled telemedicine application and verified that I am speaking with the correct person using two identifiers.  Location patient: Home Location provider: [facility], Office Persons participating in the virtual visit: Patient, Provider, CMA (Jess B)  I discussed the limitations of evaluation and management by telemedicine and the availability of in person appointments. The patient expressed understanding and agreed to proceed.  Subjective:   HPI:   Bilateral PE- pt remains on Xarelto 20mg  daily.    Asthma- chronic problem, on Qvar 1 puff BID and Albuterol prn.  Needs refill on Albuterol.  'it's been really doing good.  Rarely requiring rescue inhaler.    Obesity- pt reports weight today is 387 which is down 27 lbs since last recorded weight.  Currently on Qsymia and low carb diet.  Has 'cut out sweets'.  Not exercising at this time.  ROS:   See pertinent positives and negatives per HPI.  Patient Active Problem List   Diagnosis Date Noted  . Vitamin D deficiency 07/31/2017  . Bilateral pulmonary embolism (HCC) 05/22/2017  . Amenorrhea 02/12/2014  . HTN (hypertension) 01/15/2014  . Low back pain 08/09/2012  . Edema 08/09/2012  . Morbid obesity with BMI of 60.0-69.9, adult (HCC)   . Seasonal allergies   . PCOS (polycystic ovarian syndrome)   . Oligomenorrhea   . HSV-2 infection   . Anovulation   . Ovarian cyst   . Knee pain 12/06/2011  . General medical examination 09/27/2010  . RHINITIS 08/08/2008  . ASTHMA 08/08/2008    Social History   Tobacco Use  . Smoking status: Never Smoker  . Smokeless tobacco: Never Used  Substance Use Topics  . Alcohol use: Yes    Comment: occ.    Current Outpatient Medications:  .  albuterol (VENTOLIN HFA) 108 (90 Base) MCG/ACT inhaler, Inhale 2 puffs into the lungs every 6 (six) hours as needed for wheezing or shortness of breath.,  Disp: 18 g, Rfl: 1 .  beclomethasone (QVAR REDIHALER) 80 MCG/ACT inhaler, Inhale 1 puff into the lungs 2 (two) times daily., Disp: 3 each, Rfl: 1 .  celecoxib (CELEBREX) 200 MG capsule, Take 200 mg by mouth daily., Disp: , Rfl:  .  hydrochlorothiazide (HYDRODIURIL) 12.5 MG tablet, Take 1 tablet (12.5 mg total) by mouth daily., Disp: 90 tablet, Rfl: 0 .  losartan (COZAAR) 100 MG tablet, Take 1 tablet (100 mg total) by mouth daily., Disp: 90 tablet, Rfl: 0 .  montelukast (SINGULAIR) 10 MG tablet, Take 1 tablet (10 mg total) by mouth at bedtime., Disp: 90 tablet, Rfl: 0 .  QSYMIA 11.25-69 MG CP24, Take 1 capsule by mouth daily., Disp: , Rfl:  .  XARELTO 20 MG TABS tablet, TAKE 1 TABLET BY MOUTH ONCE DAILY WITH SUPPER, Disp: 90 tablet, Rfl: 0  No Known Allergies  Objective:   BP 122/76   Pulse 94   Ht 5\' 5"  (1.651 m)   Wt (!) 387 lb 2 oz (175.6 kg)   BMI 64.42 kg/m   AAOx3, NAD Obese NCAT, EOMI No obvious CN deficits Coloring WNL Pt is able to speak clearly, coherently without shortness of breath or increased work of breathing.  Thought process is linear.  Mood is appropriate.   Assessment and Plan:   Bilateral PE- needs to continue Xarelto daily.  Prescription filled.  Discussed the need to stop the Celebrex (started by Ortho) due to  increased risk of bleeding.  Pt expressed understanding and is in agreement w/ plan.   Asthma- pt feels sxs are controlled on her Qvar BID and rarely needs albuterol but will send refills.  Her weight loss should also help control her pulmonary sxs  Obesity- Pt is now on Qsymia and according to recent #s she is down 27 lbs.  Applauded her dietary choices and her efforts.  Will check labs to risk stratify.     Neena Rhymes, MD 12/31/2019

## 2020-01-15 ENCOUNTER — Other Ambulatory Visit: Payer: Self-pay

## 2020-01-15 ENCOUNTER — Ambulatory Visit (INDEPENDENT_AMBULATORY_CARE_PROVIDER_SITE_OTHER): Payer: BC Managed Care – PPO

## 2020-01-15 DIAGNOSIS — Z6841 Body Mass Index (BMI) 40.0 and over, adult: Secondary | ICD-10-CM | POA: Diagnosis not present

## 2020-01-15 LAB — CBC WITH DIFFERENTIAL/PLATELET
Basophils Absolute: 0 10*3/uL (ref 0.0–0.1)
Basophils Relative: 0.6 % (ref 0.0–3.0)
Eosinophils Absolute: 0 10*3/uL (ref 0.0–0.7)
Eosinophils Relative: 0.8 % (ref 0.0–5.0)
HCT: 41.3 % (ref 36.0–46.0)
Hemoglobin: 13.5 g/dL (ref 12.0–15.0)
Lymphocytes Relative: 38.8 % (ref 12.0–46.0)
Lymphs Abs: 1.8 10*3/uL (ref 0.7–4.0)
MCHC: 32.8 g/dL (ref 30.0–36.0)
MCV: 87.8 fl (ref 78.0–100.0)
Monocytes Absolute: 0.6 10*3/uL (ref 0.1–1.0)
Monocytes Relative: 12 % (ref 3.0–12.0)
Neutro Abs: 2.3 10*3/uL (ref 1.4–7.7)
Neutrophils Relative %: 47.8 % (ref 43.0–77.0)
Platelets: 346 10*3/uL (ref 150.0–400.0)
RBC: 4.7 Mil/uL (ref 3.87–5.11)
RDW: 16.5 % — ABNORMAL HIGH (ref 11.5–15.5)
WBC: 4.7 10*3/uL (ref 4.0–10.5)

## 2020-01-15 LAB — HEPATIC FUNCTION PANEL
ALT: 13 U/L (ref 0–35)
AST: 13 U/L (ref 0–37)
Albumin: 3.9 g/dL (ref 3.5–5.2)
Alkaline Phosphatase: 61 U/L (ref 39–117)
Bilirubin, Direct: 0.1 mg/dL (ref 0.0–0.3)
Total Bilirubin: 0.3 mg/dL (ref 0.2–1.2)
Total Protein: 7.2 g/dL (ref 6.0–8.3)

## 2020-01-15 LAB — BASIC METABOLIC PANEL
BUN: 15 mg/dL (ref 6–23)
CO2: 26 mEq/L (ref 19–32)
Calcium: 9.5 mg/dL (ref 8.4–10.5)
Chloride: 105 mEq/L (ref 96–112)
Creatinine, Ser: 0.89 mg/dL (ref 0.40–1.20)
GFR: 84.91 mL/min (ref >60.00)
Glucose, Bld: 104 mg/dL — ABNORMAL HIGH (ref 70–99)
Potassium: 4.2 mEq/L (ref 3.5–5.1)
Sodium: 138 mEq/L (ref 135–145)

## 2020-01-15 LAB — LIPID PANEL
Cholesterol: 213 mg/dL — ABNORMAL HIGH (ref 0–200)
HDL: 47.1 mg/dL (ref >39.00)
LDL Cholesterol: 134 mg/dL — ABNORMAL HIGH (ref 0–99)
NonHDL: 165.45
Total CHOL/HDL Ratio: 5
Triglycerides: 156 mg/dL — ABNORMAL HIGH (ref 0.0–149.0)
VLDL: 31.2 mg/dL (ref 0.0–40.0)

## 2020-01-15 LAB — TSH: TSH: 2.08 u[IU]/mL (ref 0.35–4.50)

## 2020-01-16 ENCOUNTER — Encounter: Payer: Self-pay | Admitting: General Practice

## 2020-03-09 ENCOUNTER — Other Ambulatory Visit: Payer: Self-pay | Admitting: Family Medicine

## 2020-04-02 ENCOUNTER — Other Ambulatory Visit: Payer: Self-pay | Admitting: Family Medicine

## 2020-05-18 ENCOUNTER — Other Ambulatory Visit: Payer: Self-pay | Admitting: Family Medicine

## 2020-05-29 DIAGNOSIS — A5901 Trichomonal vulvovaginitis: Secondary | ICD-10-CM | POA: Diagnosis not present

## 2020-05-29 DIAGNOSIS — Z6841 Body Mass Index (BMI) 40.0 and over, adult: Secondary | ICD-10-CM | POA: Diagnosis not present

## 2020-05-29 DIAGNOSIS — Z113 Encounter for screening for infections with a predominantly sexual mode of transmission: Secondary | ICD-10-CM | POA: Diagnosis not present

## 2020-05-29 DIAGNOSIS — Z1231 Encounter for screening mammogram for malignant neoplasm of breast: Secondary | ICD-10-CM | POA: Diagnosis not present

## 2020-05-29 DIAGNOSIS — N898 Other specified noninflammatory disorders of vagina: Secondary | ICD-10-CM | POA: Diagnosis not present

## 2020-05-29 DIAGNOSIS — Z01411 Encounter for gynecological examination (general) (routine) with abnormal findings: Secondary | ICD-10-CM | POA: Diagnosis not present

## 2020-06-10 ENCOUNTER — Other Ambulatory Visit: Payer: Self-pay | Admitting: Family Medicine

## 2020-06-23 DIAGNOSIS — M170 Bilateral primary osteoarthritis of knee: Secondary | ICD-10-CM | POA: Diagnosis not present

## 2020-07-14 ENCOUNTER — Encounter: Payer: Self-pay | Admitting: Family Medicine

## 2020-07-14 ENCOUNTER — Other Ambulatory Visit: Payer: Self-pay

## 2020-07-14 ENCOUNTER — Ambulatory Visit (INDEPENDENT_AMBULATORY_CARE_PROVIDER_SITE_OTHER): Payer: BC Managed Care – PPO | Admitting: Family Medicine

## 2020-07-14 VITALS — BP 120/85 | HR 66 | Temp 97.9°F | Resp 20 | Ht 65.0 in | Wt >= 6400 oz

## 2020-07-14 DIAGNOSIS — Z6841 Body Mass Index (BMI) 40.0 and over, adult: Secondary | ICD-10-CM

## 2020-07-14 DIAGNOSIS — E559 Vitamin D deficiency, unspecified: Secondary | ICD-10-CM

## 2020-07-14 DIAGNOSIS — I2699 Other pulmonary embolism without acute cor pulmonale: Secondary | ICD-10-CM | POA: Diagnosis not present

## 2020-07-14 DIAGNOSIS — Z0000 Encounter for general adult medical examination without abnormal findings: Secondary | ICD-10-CM | POA: Diagnosis not present

## 2020-07-14 LAB — LIPID PANEL
Cholesterol: 231 mg/dL — ABNORMAL HIGH (ref 0–200)
HDL: 43.8 mg/dL (ref >39.00)
LDL Cholesterol: 155 mg/dL — ABNORMAL HIGH (ref 0–99)
NonHDL: 186.77
Total CHOL/HDL Ratio: 5
Triglycerides: 160 mg/dL — ABNORMAL HIGH (ref 0.0–149.0)
VLDL: 32 mg/dL (ref 0.0–40.0)

## 2020-07-14 LAB — HEMOGLOBIN A1C: Hgb A1c MFr Bld: 6.4 % (ref 4.6–6.5)

## 2020-07-14 LAB — HEPATIC FUNCTION PANEL
ALT: 16 U/L (ref 0–35)
AST: 15 U/L (ref 0–37)
Albumin: 3.9 g/dL (ref 3.5–5.2)
Alkaline Phosphatase: 62 U/L (ref 39–117)
Bilirubin, Direct: 0 mg/dL (ref 0.0–0.3)
Total Bilirubin: 0.4 mg/dL (ref 0.2–1.2)
Total Protein: 8 g/dL (ref 6.0–8.3)

## 2020-07-14 LAB — CBC WITH DIFFERENTIAL/PLATELET
Basophils Absolute: 0 10*3/uL (ref 0.0–0.1)
Basophils Relative: 0.6 % (ref 0.0–3.0)
Eosinophils Absolute: 0.1 10*3/uL (ref 0.0–0.7)
Eosinophils Relative: 1.4 % (ref 0.0–5.0)
HCT: 43.4 % (ref 36.0–46.0)
Hemoglobin: 14.5 g/dL (ref 12.0–15.0)
Lymphocytes Relative: 40.2 % (ref 12.0–46.0)
Lymphs Abs: 1.8 10*3/uL (ref 0.7–4.0)
MCHC: 33.5 g/dL (ref 30.0–36.0)
MCV: 88 fl (ref 78.0–100.0)
Monocytes Absolute: 0.3 10*3/uL (ref 0.1–1.0)
Monocytes Relative: 7.3 % (ref 3.0–12.0)
Neutro Abs: 2.2 10*3/uL (ref 1.4–7.7)
Neutrophils Relative %: 50.5 % (ref 43.0–77.0)
Platelets: 327 10*3/uL (ref 150.0–400.0)
RBC: 4.93 Mil/uL (ref 3.87–5.11)
RDW: 15.2 % (ref 11.5–15.5)
WBC: 4.4 10*3/uL (ref 4.0–10.5)

## 2020-07-14 LAB — BASIC METABOLIC PANEL
BUN: 16 mg/dL (ref 6–23)
CO2: 27 mEq/L (ref 19–32)
Calcium: 9.8 mg/dL (ref 8.4–10.5)
Chloride: 102 mEq/L (ref 96–112)
Creatinine, Ser: 0.82 mg/dL (ref 0.40–1.20)
GFR: 89.3 mL/min (ref >60.00)
Glucose, Bld: 91 mg/dL (ref 70–99)
Potassium: 3.9 mEq/L (ref 3.5–5.1)
Sodium: 138 mEq/L (ref 135–145)

## 2020-07-14 LAB — TSH: TSH: 1.67 u[IU]/mL (ref 0.35–4.50)

## 2020-07-14 LAB — VITAMIN D 25 HYDROXY (VIT D DEFICIENCY, FRACTURES): VITD: 31.17 ng/mL (ref 30.00–100.00)

## 2020-07-14 NOTE — Assessment & Plan Note (Signed)
Pt's PE WNL w/ exception of morbid obesity.  Her body habitus does make a physical exam more difficult as lung and heart sounds are distant and it is hard to get a good abdominal exam.  She is UTD on Tdap, COVID.  She plans to get her booster.  UTD on pap and mammo.  Check labs.  Anticipatory guidance provided.

## 2020-07-14 NOTE — Assessment & Plan Note (Signed)
Deteriorated.  Pt has gained 20 lbs since last visit.  She has a bariatric MD (Dr Cathey Endow) and she plans to reach out to discuss the possibility of bariatric surgery now that her insurance covers this.  Check labs to risk stratify.  Will follow.

## 2020-07-14 NOTE — Assessment & Plan Note (Signed)
Pt has hx of this.  Check labs and replete prn. 

## 2020-07-14 NOTE — Patient Instructions (Addendum)
Follow up in 6 months to recheck BP We'll notify you of your lab results and make any changes if needed Call your hematologist and see if they want to schedule an appt Call Dr Cathey Endow and you 2 can decide which way you want to go Get your booster!!! Call with any questions or concerns Stay safe!  Stay Healthy! Enjoy [place]!!!

## 2020-07-14 NOTE — Assessment & Plan Note (Signed)
Pt reports the plan is life long anticoagulation.  She has not seen Hematology recently.  Encouraged her to call and see if they want her to follow up.  Pt expressed understanding and is in agreement w/ plan.

## 2020-07-14 NOTE — Progress Notes (Signed)
   Subjective:    Patient ID: Taylor Frye, female    DOB: 1980-07-27, 40 y.o.   MRN: [id number]  HPI CPE- UTD on Tdap, COVID.  UTD on pap, mammo [facility])  Reviewed past medical, surgical, family and social histories.   Patient Care Team    Relationship Specialty Notifications Start End  Sheliah Hatch, MD PCP - General   03/31/10   Hal Morales, MD (Inactive) Consulting Physician Obstetrics and Gynecology  07/15/15     Health Maintenance  Topic Date Due  . INFLUENZA VACCINE  08/30/2020 (Originally 11/17/2019)  . COVID-19 Vaccine (3 - Booster for Pfizer series) 12/07/2020 (Originally 02/11/2020)  . Hepatitis C Screening  07/14/2021 (Originally [birth year])  . TETANUS/TDAP  12/05/2021  . PAP SMEAR-Modifier  05/25/2022  . HIV Screening  Completed  . HPV VACCINES  Aged Out      Review of Systems Patient reports no vision/ hearing changes, adenopathy,fever, persistant/recurrent hoarseness , swallowing issues, chest pain, palpitations, edema, persistant/recurrent cough, hemoptysis, dyspnea (rest/exertional/paroxysmal nocturnal), gastrointestinal bleeding (melena, rectal bleeding), abdominal pain, significant heartburn, bowel changes, GU symptoms (dysuria, hematuria, incontinence), Gyn symptoms (abnormal  bleeding, pain),  syncope, focal weakness, memory loss, numbness & tingling, skin/hair/nail changes, abnormal bruising or bleeding, anxiety, or depression.   + 20 lb weight gain- plans to follow up w/ bariatric doc as she considers surgery  This visit occurred during the SARS-CoV-2 public health emergency.  Safety protocols were in place, including screening questions prior to the visit, additional usage of staff PPE, and extensive cleaning of exam room while observing appropriate contact time as indicated for disinfecting solutions.       Objective:   Physical Exam General Appearance:    Alert, cooperative, no distress, appears stated age, morbidly obese   Head:    Normocephalic, without obvious abnormality, atraumatic  Eyes:    PERRL, conjunctiva/corneas clear, EOM's intact, fundi    benign, both eyes  Ears:    Normal TM's and external ear canals, both ears  Nose:   Deferred due to COVID  Throat:   Neck:   Supple, symmetrical, trachea midline, no adenopathy;    Thyroid: no enlargement/tenderness/nodules  Back:     Symmetric, no curvature, ROM normal, no CVA tenderness  Lungs:     Distant breath sounds, but clear to auscultation bilaterally, respirations unlabored  Chest Wall:    No tenderness or deformity   Heart:    Regular rate and rhythm, S1 and S2 normal, no murmur, rub   or gallop  Breast Exam:    Deferred to GYN  Abdomen:     Soft, non-tender, bowel sounds active all four quadrants,    no masses, no organomegaly  Genitalia:    Deferred to GYN  Rectal:    Extremities:   Extremities normal, atraumatic, no cyanosis or edema  Pulses:   2+ and symmetric all extremities  Skin:   Skin color, texture, turgor normal, no rashes or lesions  Lymph nodes:   Cervical, supraclavicular, and axillary nodes normal  Neurologic:   CNII-XII intact, normal strength, sensation and reflexes    throughout         Assessment & Plan:

## 2020-07-15 ENCOUNTER — Other Ambulatory Visit: Payer: Self-pay

## 2020-07-15 DIAGNOSIS — E785 Hyperlipidemia, unspecified: Secondary | ICD-10-CM

## 2020-07-15 MED ORDER — ROSUVASTATIN CALCIUM 10 MG PO TABS: 10.0000 mg | ORAL_TABLET | Freq: Every day | ORAL | 1 refills | Status: DC

## 2020-07-18 ENCOUNTER — Other Ambulatory Visit: Payer: Self-pay | Admitting: Family Medicine

## 2020-08-10 ENCOUNTER — Other Ambulatory Visit: Payer: Self-pay | Admitting: Family Medicine

## 2020-09-07 ENCOUNTER — Other Ambulatory Visit: Payer: Self-pay | Admitting: Family Medicine

## 2020-09-07 ENCOUNTER — Other Ambulatory Visit: Payer: BC Managed Care – PPO

## 2020-09-22 DIAGNOSIS — G4733 Obstructive sleep apnea (adult) (pediatric): Secondary | ICD-10-CM | POA: Diagnosis not present

## 2020-09-22 DIAGNOSIS — Z6841 Body Mass Index (BMI) 40.0 and over, adult: Secondary | ICD-10-CM | POA: Diagnosis not present

## 2020-09-22 DIAGNOSIS — R7303 Prediabetes: Secondary | ICD-10-CM | POA: Diagnosis not present

## 2020-09-22 DIAGNOSIS — I10 Essential (primary) hypertension: Secondary | ICD-10-CM | POA: Diagnosis not present

## 2020-10-14 ENCOUNTER — Encounter: Payer: Self-pay | Admitting: *Deleted

## 2020-11-05 ENCOUNTER — Other Ambulatory Visit: Payer: Self-pay | Admitting: Family Medicine

## 2020-11-23 DIAGNOSIS — I10 Essential (primary) hypertension: Secondary | ICD-10-CM | POA: Diagnosis not present

## 2020-11-23 DIAGNOSIS — E282 Polycystic ovarian syndrome: Secondary | ICD-10-CM | POA: Diagnosis not present

## 2020-11-23 DIAGNOSIS — G4733 Obstructive sleep apnea (adult) (pediatric): Secondary | ICD-10-CM | POA: Diagnosis not present

## 2020-11-23 DIAGNOSIS — Z6841 Body Mass Index (BMI) 40.0 and over, adult: Secondary | ICD-10-CM | POA: Diagnosis not present

## 2020-11-24 DIAGNOSIS — I10 Essential (primary) hypertension: Secondary | ICD-10-CM | POA: Diagnosis not present

## 2020-11-24 DIAGNOSIS — M170 Bilateral primary osteoarthritis of knee: Secondary | ICD-10-CM | POA: Diagnosis not present

## 2020-11-24 DIAGNOSIS — M25561 Pain in right knee: Secondary | ICD-10-CM | POA: Diagnosis not present

## 2020-11-24 DIAGNOSIS — G8929 Other chronic pain: Secondary | ICD-10-CM | POA: Diagnosis not present

## 2020-11-24 DIAGNOSIS — M25562 Pain in left knee: Secondary | ICD-10-CM | POA: Diagnosis not present

## 2020-12-04 ENCOUNTER — Other Ambulatory Visit: Payer: Self-pay | Admitting: Family Medicine

## 2020-12-16 ENCOUNTER — Other Ambulatory Visit: Payer: Self-pay

## 2020-12-16 ENCOUNTER — Telehealth: Payer: Self-pay | Admitting: Family Medicine

## 2020-12-16 MED ORDER — ALBUTEROL SULFATE HFA 108 (90 BASE) MCG/ACT IN AERS
2.0000 | INHALATION_SPRAY | Freq: Four times a day (QID) | RESPIRATORY_TRACT | 0 refills | Status: DC | PRN
Start: 2020-12-16 — End: 2023-02-01

## 2020-12-16 NOTE — Telephone Encounter (Signed)
Patient needs her albuterol inhaler refilled.  She has an upcoming appt. In September with Dr. Beverely Low - Please send to [facility] [address] in [place] -

## 2020-12-16 NOTE — Telephone Encounter (Signed)
Medication has been sent to the pharmacy. 

## 2020-12-24 DIAGNOSIS — J45909 Unspecified asthma, uncomplicated: Secondary | ICD-10-CM | POA: Diagnosis not present

## 2020-12-24 DIAGNOSIS — G4719 Other hypersomnia: Secondary | ICD-10-CM | POA: Diagnosis not present

## 2020-12-24 DIAGNOSIS — R0683 Snoring: Secondary | ICD-10-CM | POA: Diagnosis not present

## 2020-12-24 DIAGNOSIS — G4733 Obstructive sleep apnea (adult) (pediatric): Secondary | ICD-10-CM | POA: Diagnosis not present

## 2020-12-30 DIAGNOSIS — Z1339 Encounter for screening examination for other mental health and behavioral disorders: Secondary | ICD-10-CM | POA: Diagnosis not present

## 2020-12-30 DIAGNOSIS — F419 Anxiety disorder, unspecified: Secondary | ICD-10-CM | POA: Diagnosis not present

## 2021-01-06 ENCOUNTER — Encounter: Payer: Self-pay | Admitting: Family Medicine

## 2021-01-08 DIAGNOSIS — G8911 Acute pain due to trauma: Secondary | ICD-10-CM | POA: Diagnosis not present

## 2021-01-08 DIAGNOSIS — M545 Low back pain, unspecified: Secondary | ICD-10-CM | POA: Diagnosis not present

## 2021-01-08 DIAGNOSIS — S161XXA Strain of muscle, fascia and tendon at neck level, initial encounter: Secondary | ICD-10-CM | POA: Diagnosis not present

## 2021-01-08 DIAGNOSIS — M47817 Spondylosis without myelopathy or radiculopathy, lumbosacral region: Secondary | ICD-10-CM | POA: Diagnosis not present

## 2021-01-08 DIAGNOSIS — S060X0A Concussion without loss of consciousness, initial encounter: Secondary | ICD-10-CM | POA: Diagnosis not present

## 2021-01-08 DIAGNOSIS — J019 Acute sinusitis, unspecified: Secondary | ICD-10-CM | POA: Diagnosis not present

## 2021-01-08 DIAGNOSIS — J45909 Unspecified asthma, uncomplicated: Secondary | ICD-10-CM | POA: Diagnosis not present

## 2021-01-08 DIAGNOSIS — Z043 Encounter for examination and observation following other accident: Secondary | ICD-10-CM | POA: Diagnosis not present

## 2021-01-08 DIAGNOSIS — M47816 Spondylosis without myelopathy or radiculopathy, lumbar region: Secondary | ICD-10-CM | POA: Diagnosis not present

## 2021-01-08 DIAGNOSIS — M542 Cervicalgia: Secondary | ICD-10-CM | POA: Diagnosis not present

## 2021-01-08 DIAGNOSIS — E282 Polycystic ovarian syndrome: Secondary | ICD-10-CM | POA: Diagnosis not present

## 2021-01-08 DIAGNOSIS — Z7951 Long term (current) use of inhaled steroids: Secondary | ICD-10-CM | POA: Diagnosis not present

## 2021-01-08 DIAGNOSIS — Z86711 Personal history of pulmonary embolism: Secondary | ICD-10-CM | POA: Diagnosis not present

## 2021-01-08 DIAGNOSIS — I10 Essential (primary) hypertension: Secondary | ICD-10-CM | POA: Diagnosis not present

## 2021-01-08 DIAGNOSIS — R519 Headache, unspecified: Secondary | ICD-10-CM | POA: Diagnosis not present

## 2021-01-08 DIAGNOSIS — M2578 Osteophyte, vertebrae: Secondary | ICD-10-CM | POA: Diagnosis not present

## 2021-01-08 DIAGNOSIS — Z79899 Other long term (current) drug therapy: Secondary | ICD-10-CM | POA: Diagnosis not present

## 2021-01-08 DIAGNOSIS — G4733 Obstructive sleep apnea (adult) (pediatric): Secondary | ICD-10-CM | POA: Diagnosis not present

## 2021-01-08 DIAGNOSIS — Y92410 Unspecified street and highway as the place of occurrence of the external cause: Secondary | ICD-10-CM | POA: Diagnosis not present

## 2021-01-14 ENCOUNTER — Encounter: Payer: Self-pay | Admitting: Family Medicine

## 2021-01-14 ENCOUNTER — Other Ambulatory Visit: Payer: Self-pay

## 2021-01-14 ENCOUNTER — Ambulatory Visit: Payer: BC Managed Care – PPO | Admitting: Family Medicine

## 2021-01-14 VITALS — BP 128/84 | HR 111 | Temp 98.8°F | Resp 16 | Ht 65.0 in | Wt >= 6400 oz

## 2021-01-14 DIAGNOSIS — M79601 Pain in right arm: Secondary | ICD-10-CM

## 2021-01-14 DIAGNOSIS — E785 Hyperlipidemia, unspecified: Secondary | ICD-10-CM

## 2021-01-14 DIAGNOSIS — I10 Essential (primary) hypertension: Secondary | ICD-10-CM

## 2021-01-14 DIAGNOSIS — R7303 Prediabetes: Secondary | ICD-10-CM | POA: Insufficient documentation

## 2021-01-14 DIAGNOSIS — M79602 Pain in left arm: Secondary | ICD-10-CM

## 2021-01-14 LAB — CBC WITH DIFFERENTIAL/PLATELET
Basophils Absolute: 0 10*3/uL (ref 0.0–0.1)
Basophils Relative: 0.6 % (ref 0.0–3.0)
Eosinophils Absolute: 0.1 10*3/uL (ref 0.0–0.7)
Eosinophils Relative: 1.4 % (ref 0.0–5.0)
HCT: 40.9 % (ref 36.0–46.0)
Hemoglobin: 13.6 g/dL (ref 12.0–15.0)
Lymphocytes Relative: 25.7 % (ref 12.0–46.0)
Lymphs Abs: 1.3 10*3/uL (ref 0.7–4.0)
MCHC: 33.1 g/dL (ref 30.0–36.0)
MCV: 86.6 fl (ref 78.0–100.0)
Monocytes Absolute: 0.5 10*3/uL (ref 0.1–1.0)
Monocytes Relative: 10.6 % (ref 3.0–12.0)
Neutro Abs: 3.2 10*3/uL (ref 1.4–7.7)
Neutrophils Relative %: 61.7 % (ref 43.0–77.0)
Platelets: 425 10*3/uL — ABNORMAL HIGH (ref 150.0–400.0)
RBC: 4.73 Mil/uL (ref 3.87–5.11)
RDW: 16.8 % — ABNORMAL HIGH (ref 11.5–15.5)
WBC: 5.1 10*3/uL (ref 4.0–10.5)

## 2021-01-14 LAB — HEPATIC FUNCTION PANEL
ALT: 13 U/L (ref 0–35)
AST: 15 U/L (ref 0–37)
Albumin: 3.8 g/dL (ref 3.5–5.2)
Alkaline Phosphatase: 48 U/L (ref 39–117)
Bilirubin, Direct: 0.1 mg/dL (ref 0.0–0.3)
Total Bilirubin: 0.4 mg/dL (ref 0.2–1.2)
Total Protein: 7.5 g/dL (ref 6.0–8.3)

## 2021-01-14 LAB — LIPID PANEL
Cholesterol: 139 mg/dL (ref 0–200)
HDL: 40.3 mg/dL (ref >39.00)
LDL Cholesterol: 74 mg/dL (ref 0–99)
NonHDL: 98.86
Total CHOL/HDL Ratio: 3
Triglycerides: 125 mg/dL (ref 0.0–149.0)
VLDL: 25 mg/dL (ref 0.0–40.0)

## 2021-01-14 LAB — BASIC METABOLIC PANEL
BUN: 14 mg/dL (ref 6–23)
CO2: 25 mEq/L (ref 19–32)
Calcium: 9.3 mg/dL (ref 8.4–10.5)
Chloride: 103 mEq/L (ref 96–112)
Creatinine, Ser: 0.8 mg/dL (ref 0.40–1.20)
GFR: 91.66 mL/min (ref >60.00)
Glucose, Bld: 107 mg/dL — ABNORMAL HIGH (ref 70–99)
Potassium: 4 mEq/L (ref 3.5–5.1)
Sodium: 137 mEq/L (ref 135–145)

## 2021-01-14 LAB — TSH: TSH: 2.07 u[IU]/mL (ref 0.35–5.50)

## 2021-01-14 LAB — HEMOGLOBIN A1C: Hgb A1c MFr Bld: 6.1 % (ref 4.6–6.5)

## 2021-01-14 MED ORDER — DICLOFENAC SODIUM 1 % EX GEL: 2.0000 g | Freq: Four times a day (QID) | CUTANEOUS | 1 refills | Status: DC

## 2021-01-14 NOTE — Assessment & Plan Note (Signed)
Chronic problem.  On Crestor 10mg daily w/o difficulty.  Check labs.  Adjust meds prn  

## 2021-01-14 NOTE — Progress Notes (Signed)
   Subjective:    Patient ID: Taylor Frye, female    DOB: Dec 16, 1980, 40 y.o.   MRN: [id number]  HPI HTN- chronic problem, on HCTZ 12.5mg  daily and Losartan 100mg  daily.  No CP, SOB, HAs, visual changes,  Hyperlipidemia- chronic problem, on Crestor 10mg  daily.  No abd pain, N/V  Pre-diabetes- last A1C 6.4%  Now on Ozempic from Bariatric specialist.  Plan is to proceed w/ bariatric surgery  Arm pain- pt reports pain in both elbows.  Sxs started ~1 week ago.  Elbows are throbbing and woke her from sleep.  Pt reports she has changed jobs and doing a lot more typing and repetitive arm movements   Review of Systems For ROS see HPI   This visit occurred during the SARS-CoV-2 public health emergency.  Safety protocols were in place, including screening questions prior to the visit, additional usage of staff PPE, and extensive cleaning of exam room while observing appropriate contact time as indicated for disinfecting solutions.      Objective:   Physical Exam Vitals reviewed.  Constitutional:      General: She is not in acute distress.    Appearance: Normal appearance. She is well-developed. She is obese. She is not ill-appearing.  HENT:     Head: Normocephalic and atraumatic.  Eyes:     Conjunctiva/sclera: Conjunctivae normal.     Pupils: Pupils are equal, round, and reactive to light.  Neck:     Thyroid: No thyromegaly.  Cardiovascular:     Rate and Rhythm: Normal rate and regular rhythm.     Pulses: Normal pulses.     Heart sounds: Normal heart sounds. No murmur heard. Pulmonary:     Effort: Pulmonary effort is normal. No respiratory distress.     Breath sounds: Normal breath sounds.  Abdominal:     General: There is no distension.     Palpations: Abdomen is soft.     Tenderness: There is no abdominal tenderness.  Musculoskeletal:        General: Tenderness (TTP over lateral epicondyles bilaterally) present.     Cervical back: Normal range of motion and neck supple.      Right lower leg: No edema.     Left lower leg: No edema.  Lymphadenopathy:     Cervical: No cervical adenopathy.  Skin:    General: Skin is warm and dry.  Neurological:     Mental Status: She is alert and oriented to person, place, and time.  Psychiatric:        Mood and Affect: Mood normal.        Behavior: Behavior normal.          Assessment & Plan:   Bilateral arm pain- new.  Given increased repetitive motion and location of pain, she likely has lateral epicondylitis.  Since she is on anticoagulation, will start topical Voltaren gel and ice.  Pt expressed understanding and is in agreement w/ plan.

## 2021-01-14 NOTE — Patient Instructions (Addendum)
Schedule your complete physical in 6 months We'll notify you of your lab results and make any changes if needed Continue to work on healthy diet and regular exercise- you can do it! Use the Voltaren gel on the elbows 3-4 times/day ICE! Call with any questions or concerns I'm SO proud of you!

## 2021-01-14 NOTE — Assessment & Plan Note (Signed)
Last A1C 6.4%  She was started on Ozempic by Bariatrics b/c she is trying to lose weight prior to gastric sleeve.  Check labs today and determine if changes are needed.

## 2021-01-14 NOTE — Assessment & Plan Note (Signed)
Chronic problem, well controlled on HCTZ and Losartan.  Currently asymptomatic.  Check labs due to ARB and diuretic but no anticipated med changes.  Will follow.

## 2021-01-15 DIAGNOSIS — Z1339 Encounter for screening examination for other mental health and behavioral disorders: Secondary | ICD-10-CM | POA: Diagnosis not present

## 2021-01-15 DIAGNOSIS — F411 Generalized anxiety disorder: Secondary | ICD-10-CM | POA: Diagnosis not present

## 2021-01-20 DIAGNOSIS — F411 Generalized anxiety disorder: Secondary | ICD-10-CM | POA: Diagnosis not present

## 2021-02-02 ENCOUNTER — Other Ambulatory Visit: Payer: Self-pay | Admitting: Family Medicine

## 2021-02-10 DIAGNOSIS — F411 Generalized anxiety disorder: Secondary | ICD-10-CM | POA: Diagnosis not present

## 2021-02-11 DIAGNOSIS — Z713 Dietary counseling and surveillance: Secondary | ICD-10-CM | POA: Diagnosis not present

## 2021-02-19 ENCOUNTER — Other Ambulatory Visit: Payer: Self-pay | Admitting: Family Medicine

## 2021-02-23 DIAGNOSIS — G4733 Obstructive sleep apnea (adult) (pediatric): Secondary | ICD-10-CM | POA: Diagnosis not present

## 2021-02-23 DIAGNOSIS — I10 Essential (primary) hypertension: Secondary | ICD-10-CM | POA: Diagnosis not present

## 2021-02-23 DIAGNOSIS — Z6841 Body Mass Index (BMI) 40.0 and over, adult: Secondary | ICD-10-CM | POA: Diagnosis not present

## 2021-02-24 DIAGNOSIS — Z7189 Other specified counseling: Secondary | ICD-10-CM | POA: Diagnosis not present

## 2021-02-24 DIAGNOSIS — Z6841 Body Mass Index (BMI) 40.0 and over, adult: Secondary | ICD-10-CM | POA: Diagnosis not present

## 2021-02-24 DIAGNOSIS — F54 Psychological and behavioral factors associated with disorders or diseases classified elsewhere: Secondary | ICD-10-CM | POA: Diagnosis not present

## 2021-03-01 ENCOUNTER — Encounter: Payer: Self-pay | Admitting: Family Medicine

## 2021-03-03 DIAGNOSIS — F411 Generalized anxiety disorder: Secondary | ICD-10-CM | POA: Diagnosis not present

## 2021-03-04 DIAGNOSIS — F411 Generalized anxiety disorder: Secondary | ICD-10-CM | POA: Diagnosis not present

## 2021-03-07 ENCOUNTER — Other Ambulatory Visit: Payer: Self-pay | Admitting: Family Medicine

## 2021-03-26 DIAGNOSIS — Z713 Dietary counseling and surveillance: Secondary | ICD-10-CM | POA: Diagnosis not present

## 2021-03-29 DIAGNOSIS — Z713 Dietary counseling and surveillance: Secondary | ICD-10-CM | POA: Diagnosis not present

## 2021-03-30 ENCOUNTER — Other Ambulatory Visit: Payer: Self-pay | Admitting: Family Medicine

## 2021-03-30 DIAGNOSIS — Z0279 Encounter for issue of other medical certificate: Secondary | ICD-10-CM

## 2021-04-02 DIAGNOSIS — F411 Generalized anxiety disorder: Secondary | ICD-10-CM | POA: Diagnosis not present

## 2021-04-06 ENCOUNTER — Encounter: Payer: Self-pay | Admitting: Family Medicine

## 2021-04-06 MED ORDER — BUDESONIDE 90 MCG/ACT IN AEPB: 2.0000 | INHALATION_SPRAY | Freq: Two times a day (BID) | RESPIRATORY_TRACT | 1 refills | Status: DC

## 2021-04-13 ENCOUNTER — Telehealth: Payer: Self-pay

## 2021-04-13 NOTE — Telephone Encounter (Signed)
Qvar was on back order, budesonide also needs a PA. Below are the "preferred" per cover my meds.  FLOVENT HFA AER ARNUITY ELPT INH  FLOVENT DISK AER   Please advise

## 2021-04-13 NOTE — Telephone Encounter (Signed)
Caller name:[facility] Christell Constant)   On DPR? :Yes  Call back [phone] option [phone]  Provider they see: Beverely Low   Reason for call:calling about a pro-authorization Budesonide 90 MCG/ACT inhaler

## 2021-04-14 ENCOUNTER — Other Ambulatory Visit: Payer: Self-pay | Admitting: Registered Nurse

## 2021-04-14 DIAGNOSIS — J4540 Moderate persistent asthma, uncomplicated: Secondary | ICD-10-CM

## 2021-04-14 MED ORDER — FLUTICASONE PROPIONATE HFA 220 MCG/ACT IN AERO: 1.0000 | INHALATION_SPRAY | Freq: Every day | RESPIRATORY_TRACT | 12 refills | Status: DC

## 2021-04-15 NOTE — Telephone Encounter (Signed)
I appreciate Rich sending in Flovent for pt.  She should take as directed

## 2021-04-26 DIAGNOSIS — Z723 Lack of physical exercise: Secondary | ICD-10-CM | POA: Diagnosis not present

## 2021-04-26 DIAGNOSIS — Z6841 Body Mass Index (BMI) 40.0 and over, adult: Secondary | ICD-10-CM | POA: Diagnosis not present

## 2021-04-27 ENCOUNTER — Other Ambulatory Visit: Payer: Self-pay | Admitting: Family Medicine

## 2021-04-27 DIAGNOSIS — E785 Hyperlipidemia, unspecified: Secondary | ICD-10-CM

## 2021-04-28 DIAGNOSIS — F411 Generalized anxiety disorder: Secondary | ICD-10-CM | POA: Diagnosis not present

## 2021-05-04 DIAGNOSIS — Z6841 Body Mass Index (BMI) 40.0 and over, adult: Secondary | ICD-10-CM | POA: Diagnosis not present

## 2021-05-04 DIAGNOSIS — I10 Essential (primary) hypertension: Secondary | ICD-10-CM | POA: Diagnosis not present

## 2021-05-04 DIAGNOSIS — R7303 Prediabetes: Secondary | ICD-10-CM | POA: Diagnosis not present

## 2021-05-19 DIAGNOSIS — G8929 Other chronic pain: Secondary | ICD-10-CM | POA: Diagnosis not present

## 2021-05-19 DIAGNOSIS — I10 Essential (primary) hypertension: Secondary | ICD-10-CM | POA: Diagnosis not present

## 2021-05-19 DIAGNOSIS — M170 Bilateral primary osteoarthritis of knee: Secondary | ICD-10-CM | POA: Diagnosis not present

## 2021-05-19 DIAGNOSIS — M25562 Pain in left knee: Secondary | ICD-10-CM | POA: Diagnosis not present

## 2021-05-19 DIAGNOSIS — M25561 Pain in right knee: Secondary | ICD-10-CM | POA: Diagnosis not present

## 2021-05-25 DIAGNOSIS — I10 Essential (primary) hypertension: Secondary | ICD-10-CM | POA: Diagnosis not present

## 2021-05-25 DIAGNOSIS — F411 Generalized anxiety disorder: Secondary | ICD-10-CM | POA: Diagnosis not present

## 2021-05-25 DIAGNOSIS — Z6841 Body Mass Index (BMI) 40.0 and over, adult: Secondary | ICD-10-CM | POA: Diagnosis not present

## 2021-05-25 DIAGNOSIS — J4530 Mild persistent asthma, uncomplicated: Secondary | ICD-10-CM | POA: Diagnosis not present

## 2021-05-25 DIAGNOSIS — G4733 Obstructive sleep apnea (adult) (pediatric): Secondary | ICD-10-CM | POA: Diagnosis not present

## 2021-06-04 ENCOUNTER — Other Ambulatory Visit: Payer: Self-pay | Admitting: Family Medicine

## 2021-06-18 DIAGNOSIS — F411 Generalized anxiety disorder: Secondary | ICD-10-CM | POA: Diagnosis not present

## 2021-07-05 ENCOUNTER — Other Ambulatory Visit: Payer: Self-pay | Admitting: Family Medicine

## 2021-07-10 DIAGNOSIS — G4733 Obstructive sleep apnea (adult) (pediatric): Secondary | ICD-10-CM | POA: Diagnosis not present

## 2021-07-15 ENCOUNTER — Encounter: Payer: BC Managed Care – PPO | Admitting: Family Medicine

## 2021-08-03 DIAGNOSIS — R7303 Prediabetes: Secondary | ICD-10-CM | POA: Diagnosis not present

## 2021-08-03 DIAGNOSIS — G4733 Obstructive sleep apnea (adult) (pediatric): Secondary | ICD-10-CM | POA: Diagnosis not present

## 2021-08-03 DIAGNOSIS — I10 Essential (primary) hypertension: Secondary | ICD-10-CM | POA: Diagnosis not present

## 2021-08-03 DIAGNOSIS — Z6841 Body Mass Index (BMI) 40.0 and over, adult: Secondary | ICD-10-CM | POA: Diagnosis not present

## 2021-08-04 ENCOUNTER — Ambulatory Visit (INDEPENDENT_AMBULATORY_CARE_PROVIDER_SITE_OTHER): Payer: BC Managed Care – PPO | Admitting: Family Medicine

## 2021-08-04 ENCOUNTER — Encounter: Payer: Self-pay | Admitting: Family Medicine

## 2021-08-04 VITALS — BP 136/72 | HR 91 | Temp 98.5°F | Resp 18 | Ht 65.0 in | Wt 381.6 lb

## 2021-08-04 DIAGNOSIS — Z6841 Body Mass Index (BMI) 40.0 and over, adult: Secondary | ICD-10-CM | POA: Diagnosis not present

## 2021-08-04 DIAGNOSIS — Z1159 Encounter for screening for other viral diseases: Secondary | ICD-10-CM | POA: Diagnosis not present

## 2021-08-04 DIAGNOSIS — M79601 Pain in right arm: Secondary | ICD-10-CM

## 2021-08-04 DIAGNOSIS — Z0000 Encounter for general adult medical examination without abnormal findings: Secondary | ICD-10-CM

## 2021-08-04 DIAGNOSIS — M79602 Pain in left arm: Secondary | ICD-10-CM | POA: Diagnosis not present

## 2021-08-04 DIAGNOSIS — Z0001 Encounter for general adult medical examination with abnormal findings: Secondary | ICD-10-CM | POA: Diagnosis not present

## 2021-08-04 DIAGNOSIS — E559 Vitamin D deficiency, unspecified: Secondary | ICD-10-CM | POA: Diagnosis not present

## 2021-08-04 LAB — LIPID PANEL
Cholesterol: 148 mg/dL (ref 0–200)
HDL: 40.4 mg/dL (ref >39.00)
LDL Cholesterol: 77 mg/dL (ref 0–99)
NonHDL: 107.52
Total CHOL/HDL Ratio: 4
Triglycerides: 151 mg/dL — ABNORMAL HIGH (ref 0.0–149.0)
VLDL: 30.2 mg/dL (ref 0.0–40.0)

## 2021-08-04 LAB — BASIC METABOLIC PANEL
BUN: 14 mg/dL (ref 6–23)
CO2: 26 mEq/L (ref 19–32)
Calcium: 9.6 mg/dL (ref 8.4–10.5)
Chloride: 102 mEq/L (ref 96–112)
Creatinine, Ser: 0.8 mg/dL (ref 0.40–1.20)
GFR: 91.31 mL/min (ref >60.00)
Glucose, Bld: 82 mg/dL (ref 70–99)
Potassium: 3.9 mEq/L (ref 3.5–5.1)
Sodium: 138 mEq/L (ref 135–145)

## 2021-08-04 LAB — CBC WITH DIFFERENTIAL/PLATELET
Basophils Absolute: 0 10*3/uL (ref 0.0–0.1)
Basophils Relative: 0.4 % (ref 0.0–3.0)
Eosinophils Absolute: 0 10*3/uL (ref 0.0–0.7)
Eosinophils Relative: 1.4 % (ref 0.0–5.0)
HCT: 40.9 % (ref 36.0–46.0)
Hemoglobin: 13.7 g/dL (ref 12.0–15.0)
Lymphocytes Relative: 45.1 % (ref 12.0–46.0)
Lymphs Abs: 1.5 10*3/uL (ref 0.7–4.0)
MCHC: 33.4 g/dL (ref 30.0–36.0)
MCV: 87.4 fl (ref 78.0–100.0)
Monocytes Absolute: 0.4 10*3/uL (ref 0.1–1.0)
Monocytes Relative: 11.3 % (ref 3.0–12.0)
Neutro Abs: 1.4 10*3/uL (ref 1.4–7.7)
Neutrophils Relative %: 41.8 % — ABNORMAL LOW (ref 43.0–77.0)
Platelets: 366 10*3/uL (ref 150.0–400.0)
RBC: 4.68 Mil/uL (ref 3.87–5.11)
RDW: 15.2 % (ref 11.5–15.5)
WBC: 3.4 10*3/uL — ABNORMAL LOW (ref 4.0–10.5)

## 2021-08-04 LAB — HEPATIC FUNCTION PANEL
ALT: 12 U/L (ref 0–35)
AST: 16 U/L (ref 0–37)
Albumin: 4.1 g/dL (ref 3.5–5.2)
Alkaline Phosphatase: 57 U/L (ref 39–117)
Bilirubin, Direct: 0.1 mg/dL (ref 0.0–0.3)
Total Bilirubin: 0.4 mg/dL (ref 0.2–1.2)
Total Protein: 8 g/dL (ref 6.0–8.3)

## 2021-08-04 LAB — VITAMIN D 25 HYDROXY (VIT D DEFICIENCY, FRACTURES): VITD: 40.53 ng/mL (ref 30.00–100.00)

## 2021-08-04 LAB — TSH: TSH: 2.15 u[IU]/mL (ref 0.35–5.50)

## 2021-08-04 LAB — HEMOGLOBIN A1C: Hgb A1c MFr Bld: 6 % (ref 4.6–6.5)

## 2021-08-04 NOTE — Assessment & Plan Note (Signed)
Pt's PE WNL w/ exception of obesity.  UTD on pap, Tdap.  Due for mammo- pt to schedule.  Check labs.  Anticipatory guidance provided.  ?

## 2021-08-04 NOTE — Assessment & Plan Note (Signed)
Pt is down 20 lbs since last visit.  She is currently on Ozempic and working on her diet.  Applauded her efforts.  Will continue to follow along. ?

## 2021-08-04 NOTE — Progress Notes (Signed)
? ?Subjective:  ? ? Patient ID: Taylor Frye, female    DOB: 1980-10-25, 41 y.o.   MRN: [id number] ? ?HPI ?CPE- pt is down 20 lbs since last visit!!!  UTD on Tdap, pap.  Needs to reschedule mammo ? ?Patient Care Team  ?  Relationship Specialty Notifications Start End  ?Midge Minium, MD PCP - General   03/31/10   ?Eldred Manges, MD (Inactive) Consulting Physician Obstetrics and Gynecology  07/15/15   ?Waymon Amato, MD Consulting Physician Obstetrics and Gynecology  07/14/20   ?  ?Health Maintenance  ?Topic Date Due  ? Hepatitis C Screening  Never done  ? COVID-19 Vaccine (3 - Booster for Pfizer series) 10/07/2019  ? INFLUENZA VACCINE  11/16/2021  ? TETANUS/TDAP  12/05/2021  ? PAP SMEAR-Modifier  05/25/2022  ? HIV Screening  Completed  ? HPV VACCINES  Aged Out  ?  ? ? ?Review of Systems ?Patient reports no vision/ hearing changes, adenopathy,fever, persistant/recurrent hoarseness , swallowing issues, chest pain, palpitations, edema, persistant/recurrent cough, hemoptysis, dyspnea (rest/exertional/paroxysmal nocturnal), gastrointestinal bleeding (melena, rectal bleeding), abdominal pain, significant heartburn, bowel changes, GU symptoms (dysuria, hematuria, incontinence), Gyn symptoms (abnormal  bleeding, pain),  syncope, focal weakness, memory loss, numbness & tingling, skin/hair/nail changes, abnormal bruising or bleeding, anxiety, or depression.  ? ?+ 20 lb weight loss- pt has dramatically changed diet.  Has cut most carbs and sugars.  Now meal planning.  Moving more.  On Ozempic ? ?Bilateral elbow pain- minimal relief w/ Voltaren gel.  Unable to take NSAIDs regularly due to blood thinner.  Not able to have steroid injxn as this will delay weight loss surgery by 90 days. ?   ?Objective:  ? Physical Exam ?General Appearance:    Alert, cooperative, no distress, appears stated age, obese  ?Head:    Normocephalic, without obvious abnormality, atraumatic  ?Eyes:    PERRL, conjunctiva/corneas clear, EOM's intact   both eyes  ?Ears:    Normal TM's and external ear canals, both ears  ?Nose:   Nares normal, septum midline, mucosa normal, no drainage  ?  or sinus tenderness  ?Throat:   Lips, mucosa, and tongue normal; teeth and gums normal  ?Neck:   Supple, symmetrical, trachea midline, no adenopathy;  ?  Thyroid: no enlargement/tenderness/nodules  ?Back:     Symmetric, no curvature, ROM normal, no CVA tenderness  ?Lungs:     Clear to auscultation bilaterally, respirations unlabored  ?Chest Wall:    No tenderness or deformity  ? Heart:    Regular rate and rhythm, S1 and S2 normal, no murmur, rub ?  or gallop  ?Breast Exam:    Deferred to GYN  ?Abdomen:     Soft, non-tender, bowel sounds active all four quadrants,  ?  no masses, no organomegaly  ?Genitalia:    Deferred to GYN  ?Rectal:    ?Extremities:   Extremities normal, atraumatic, no cyanosis or edema  ?Pulses:   2+ and symmetric all extremities  ?Skin:   Skin color, texture, turgor normal, no rashes or lesions  ?Lymph nodes:   Cervical, supraclavicular, and axillary nodes normal  ?Neurologic:   CNII-XII intact, normal strength, sensation and reflexes  ?  throughout  ?  ? ? ? ?   ?Assessment & Plan:  ?Bilateral arm pain- deteriorated.  Pt w/ pain in both elbows.  Only minimal relief w/ Voltaren gel and she is not able to take NSAIDs due to blood thinners.  Not able to do steroid injxns as this  will delay her bariatric surgery.  Will refer to PT in hopes they can improve her sxs.  Pt expressed understanding and is in agreement w/ plan.  ? ?

## 2021-08-04 NOTE — Assessment & Plan Note (Signed)
Check labs and replete prn. 

## 2021-08-04 NOTE — Patient Instructions (Addendum)
Follow up in 6 months to recheck BP and cholesterol ?We'll notify you of your lab results and make any changes if needed ?Continue to work on healthy diet and regular exercise- you're doing great!!! ?Call and schedule your mammogram ?Call with any questions or concerns ?Stay Safe!  Stay Healthy!!! ?CONGRATS!!!! ?

## 2021-08-05 LAB — HEPATITIS C ANTIBODY
Hepatitis C Ab: NONREACTIVE
SIGNAL TO CUT-OFF: 0.17 (ref <1.00)

## 2021-08-06 DIAGNOSIS — F411 Generalized anxiety disorder: Secondary | ICD-10-CM | POA: Diagnosis not present

## 2021-08-10 DIAGNOSIS — G4733 Obstructive sleep apnea (adult) (pediatric): Secondary | ICD-10-CM | POA: Diagnosis not present

## 2021-08-19 ENCOUNTER — Ambulatory Visit: Payer: BC Managed Care – PPO | Attending: Family Medicine | Admitting: Rehabilitative and Restorative Service Providers"

## 2021-08-19 ENCOUNTER — Encounter: Payer: Self-pay | Admitting: Rehabilitative and Restorative Service Providers"

## 2021-08-19 DIAGNOSIS — R293 Abnormal posture: Secondary | ICD-10-CM | POA: Diagnosis not present

## 2021-08-19 DIAGNOSIS — M79602 Pain in left arm: Secondary | ICD-10-CM | POA: Insufficient documentation

## 2021-08-19 DIAGNOSIS — M79601 Pain in right arm: Secondary | ICD-10-CM | POA: Diagnosis not present

## 2021-08-19 DIAGNOSIS — R29898 Other symptoms and signs involving the musculoskeletal system: Secondary | ICD-10-CM | POA: Diagnosis not present

## 2021-08-19 DIAGNOSIS — M6281 Muscle weakness (generalized): Secondary | ICD-10-CM

## 2021-08-19 NOTE — Therapy (Signed)
[place] ?[facility] ?[address] [place] [address] ?[place], [place], [address] ?Phone: [phone]   Fax:  [phone] ? ?Physical Therapy Evaluation ? ?Patient Details  ?Name: Taylor Frye ?MRN: [id number] ?Date of Birth: [birth year] ?Referring Provider (PT): Dr Neena Rhymes ? ? ?Encounter Date: 08/19/2021 ? ? PT End of Session - 08/19/21 0939   ? ? Visit Number 1   ? Number of Visits 12   ? Date for PT Re-Evaluation 09/30/21   ? PT Start Time [phone]   ? PT Stop Time 0935   ? PT Time Calculation (min) 45 min   ? Activity Tolerance Patient tolerated treatment well;Patient limited by pain   ? ?  ?  ? ?  ? ? ?Past Medical History:  ?Diagnosis Date  ? Anovulation   ? Asthma   ? History of chicken pox   ? History of chicken pox   ? HSV-2 infection   ? Hypertension   ? Obesity   ? Obesity   ? Oligomenorrhea   ? Ovarian cyst   ? PCOS (polycystic ovarian syndrome)   ? Seasonal allergies   ? ? ?Past Surgical History:  ?Procedure Laterality Date  ? CYSTECTOMY    ? right  ? ? ?There were no vitals filed for this visit. ? ? ? Subjective Assessment - 08/19/21 0853   ? ? Subjective Patient reports that she has been having pain in the elbow and arms for the past year. She was treated with soem "cream" with some improvement. Symptoms have increased in the past couple of months. She is awaiting bariatric surgery and can not have some medications or injections. Primary problem is tightness and pain.   ? Pertinent History bilat arm stiffness and pain for the past yeat; HTN; obesity; arthritis   ? Patient Stated Goals get arms moving to be able to use arms for functional activities   ? Currently in Pain? Yes   ? Pain Score 3    ? Pain Location Arm   ? Pain Orientation Right;Left   ? Pain Descriptors / Indicators Aching;Nagging   ? Pain Type Chronic pain   ? Pain Onset More than a month ago   ? Pain Frequency Intermittent   always stiff  ? Aggravating Factors  movement of arms; lifting; carrying  objects   ? Pain Relieving Factors heat; OTC meds; topical analgesic   ? ?  ?  ? ?  ? ? ? ? ? [facility] PT Assessment - 08/19/21 0001   ? ?  ? Assessment  ? Medical Diagnosis Bilat arm pain   ? Referring Provider (PT) Dr Neena Rhymes   ? Onset Date/Surgical Date 07/17/20   ? Hand Dominance Left   ? Next MD Visit 02/03/22   ? Prior Therapy none   ?  ? Precautions  ? Precautions None   ?  ? Restrictions  ? Weight Bearing Restrictions No   ?  ? Balance Screen  ? Has the patient fallen in the past 6 months No   ? Has the patient had a decrease in activity level because of a fear of falling?  No   ? Is the patient reluctant to leave their home because of a fear of falling?  No   ?  ? Home Environment  ? Living Environment Private residence   ? Living Arrangements Alone   ?  ? Prior Function  ? Level of Independence Independent   ? Vocation Full time employment   ?  Vocation Requirements desk/computer x ~ 20 years   ? Leisure household chores; travel; walking 2x wk for 2-3 min up to 5 min/day   ?  ? Observation/Other Assessments  ? Focus on Therapeutic Outcomes (FOTO)  56   ?  ? Sensation  ? Additional Comments WFL's per pt report   ?  ? Posture/Postural Control  ? Posture Comments head forward; shoudlers rounded and elevated; increased thoracic kyphosis; scapulae abducted and rotated along the thoracic wall   ?  ? AROM  ? Right/Left Shoulder --   pain Rt elbow with Rt shd mvt 5/10; Lt elbow with Lt shd mvt 1/10  ? Right Shoulder Flexion 132 Degrees   ? Right Shoulder ABduction 131 Degrees   ? Left Shoulder Flexion 150 Degrees   ? Left Shoulder ABduction 145 Degrees   ? Right Elbow Flexion 111   ? Right Elbow Extension -62   ? Left Elbow Flexion 115   ? Left Elbow Extension -50   ? Right Forearm Pronation 82 Degrees   ? Right Forearm Supination 72 Degrees   ? Left Forearm Pronation 85 Degrees   ? Left Forearm Supination 50 Degrees   ?  ? Strength  ? Overall Strength Comments bilat shoulders 4/5 to 4+/5   ? Right/Left Elbow  --   assessed in available range  ? Right Elbow Flexion 4-/5   ? Right Elbow Extension 3/5   ? Left Elbow Flexion 4/5   ? Left Elbow Extension 3+/5   ?  ? Palpation  ? Palpation comment pain and tenderness with palpation Rt > Lt elbow area   ? ?  ?  ? ?  ? ? ? ? ? ? ? ? ? ? ? ? ? ?Objective measurements completed on examination: See above findings.  ? ? ? ? ? [facility] Adult PT Treatment/Exercise - 08/19/21 0001   ? ?  ? Self-Care  ? Self-Care ADL's;Other Self-Care Comments   ? ADL's functional positioning and ADL's to encourage elbow extension   ?  ? Neuro Re-ed   ? Neuro Re-ed Details  postural correction   ?  ? Shoulder Exercises: Stretch  ? Table Stretch - Flexion 2 reps;10 seconds   ? Table Stretch -Flexion Limitations holding table to stretch into elbow extension   ? ?  ?  ? ?  ? ? ? ? ? ? ? ? ? ? PT Education - 08/19/21 0930   ? ? Education Details POC HEP DN TENS   ? Person(s) Educated Patient   ? Methods Explanation;Demonstration;Tactile cues;Verbal cues;Handout   ? Comprehension Verbalized understanding;Returned demonstration;Verbal cues required;Tactile cues required   ? ?  ?  ? ?  ? ? ? ? ? ? PT Long Term Goals - 08/19/21 1005   ? ?  ? PT LONG TERM GOAL #1  ? Title Improve functional mobility, ROM, ADL's with bilat UE's allowing patient to use UE's more functionally   ? Time 6   ? Period Weeks   ? Status New   ? Target Date 09/30/21   ?  ? PT LONG TERM GOAL #2  ? Title Improve posture and alignment with patient to demonstrate increased activation and strength of postural musculature   ? Time 6   ? Period Weeks   ? Status New   ? Target Date 09/30/21   ?  ? PT LONG TERM GOAL #3  ? Title Increase AROM bilat elbow extension to -10 to -15 degrees, elbow flexion to  125 degrees, supination to 75 to 80 degrees   ? Time 6   ? Period Weeks   ? Status New   ? Target Date 09/30/21   ?  ? PT LONG TERM GOAL #4  ? Title Independent in HEP   ? Time 6   ? Period Weeks   ? Status New   ? Target Date 09/30/21   ?  ? PT LONG  TERM GOAL #5  ? Title Improve functional limitation score to 68   ? Time 6   ? Period Weeks   ? Status New   ? Target Date 09/30/21   ? ?  ?  ? ?  ? ? ? ? ? ? ? ? ? Plan - 08/19/21 0939   ? ? Clinical Impression Statement Patient presents with > 1 year history of bilat elbow stiffness, tightness and pain with no known injury/cause. She has poor posture and alighment; obesity limiting functional movement; limited UE ROM; decresaed UE strength; sedentary work and activity level; pain with shoulder elevation, elbow flexion/extension, forearm supination. Patient will benefit from PT to address problems identified.   ? Personal Factors and Comorbidities Age;Fitness;Comorbidity 1;Profession   ? Comorbidities obesity   ? Stability/Clinical Decision Making Stable/Uncomplicated   ? Clinical Decision Making Low   ? Rehab Potential Good   ? PT Frequency 2x / week   ? PT Duration 6 weeks   ? PT Treatment/Interventions ADLs/Self Care Home Management;Aquatic Therapy;Cryotherapy;Electrical Stimulation;Iontophoresis 4mg /ml Dexamethasone;Moist Heat;Ultrasound;Therapeutic activities;Therapeutic exercise;Neuromuscular re-education;Patient/family education;Manual techniques;Passive range of motion;Dry needling;Taping   ? PT Next Visit Plan review and progress with PROM, AROM, strengthening bilat UE's focus on elbow extension; possible trial of DN to biceps; trial of TENS to elbows for pain management; manual work and PROM   ? PT Home Exercise Plan [email or web address]   ? Consulted and Agree with Plan of Care Patient   ? ?  ?  ? ?  ? ? ?Patient will benefit from skilled therapeutic intervention in order to improve the following deficits and impairments:  Obesity, Decreased range of motion, Decreased activity tolerance, Pain, Impaired flexibility, Improper body mechanics, Decreased strength, Decreased mobility, Postural dysfunction ? ?Visit Diagnosis: ?Bilateral arm pain ? ?Other symptoms and signs involving the musculoskeletal system ? ?Muscle  weakness (generalized) ? ?Abnormal posture ? ? ? ? ?Problem List ?Patient Active Problem List  ? Diagnosis Date Noted  ? Hyperlipidemia 01/14/2021  ? Prediabetes 01/14/2021  ? Vitamin D deficiency 07/31/2017  ?

## 2021-08-19 NOTE — Patient Instructions (Signed)
Access Code: [email or web address] ?URL: [email or web address] ?Date: 08/19/2021 ?Prepared by: Corlis Leak ? ?Exercises ?- Bicep Stretch at Table  - 2 x daily - 7 x weekly - 1 sets - 3 reps - 30 sec  hold ? ?Patient Education ?- Trigger Point Dry Needling ?- TENS Unit ?

## 2021-08-24 DIAGNOSIS — Z9989 Dependence on other enabling machines and devices: Secondary | ICD-10-CM | POA: Diagnosis not present

## 2021-08-24 DIAGNOSIS — G4733 Obstructive sleep apnea (adult) (pediatric): Secondary | ICD-10-CM | POA: Diagnosis not present

## 2021-08-24 DIAGNOSIS — J4530 Mild persistent asthma, uncomplicated: Secondary | ICD-10-CM | POA: Diagnosis not present

## 2021-08-24 DIAGNOSIS — G4719 Other hypersomnia: Secondary | ICD-10-CM | POA: Diagnosis not present

## 2021-08-24 DIAGNOSIS — I10 Essential (primary) hypertension: Secondary | ICD-10-CM | POA: Diagnosis not present

## 2021-08-26 ENCOUNTER — Ambulatory Visit: Payer: BC Managed Care – PPO | Admitting: Rehabilitative and Restorative Service Providers"

## 2021-08-30 ENCOUNTER — Ambulatory Visit: Payer: BC Managed Care – PPO | Admitting: Rehabilitative and Restorative Service Providers"

## 2021-08-30 ENCOUNTER — Encounter: Payer: Self-pay | Admitting: Rehabilitative and Restorative Service Providers"

## 2021-08-30 DIAGNOSIS — M79601 Pain in right arm: Secondary | ICD-10-CM

## 2021-08-30 DIAGNOSIS — M6281 Muscle weakness (generalized): Secondary | ICD-10-CM

## 2021-08-30 DIAGNOSIS — R293 Abnormal posture: Secondary | ICD-10-CM | POA: Diagnosis not present

## 2021-08-30 DIAGNOSIS — M79602 Pain in left arm: Secondary | ICD-10-CM | POA: Diagnosis not present

## 2021-08-30 DIAGNOSIS — R29898 Other symptoms and signs involving the musculoskeletal system: Secondary | ICD-10-CM | POA: Diagnosis not present

## 2021-08-30 NOTE — Therapy (Signed)
[place] ?[facility] ?[address] ?[place], [place], [address] ?Phone: [phone]   Fax:  [phone] ? ?Physical Therapy Treatment ? ?Patient Details  ?Name: Taylor Frye ?MRN: [id number] ?Date of Birth: [birth year] ?Referring Provider (PT): Dr Neena RhymesKatherine Tabori ? ? ?Encounter Date: 08/30/2021 ? ? PT End of Session - 08/30/21 0810   ? ? Visit Number 2   ? Number of Visits 12   ? Date for PT Re-Evaluation 09/30/21   ? PT Start Time [phone]   ? PT Stop Time 0859   ? PT Time Calculation (min) 49 min   ? Activity Tolerance Patient tolerated treatment well;Patient limited by pain   ? ?  ?  ? ?  ? ? ?Past Medical History:  ?Diagnosis Date  ? Anovulation   ? Asthma   ? History of chicken pox   ? History of chicken pox   ? HSV-2 infection   ? Hypertension   ? Obesity   ? Obesity   ? Oligomenorrhea   ? Ovarian cyst   ? PCOS (polycystic ovarian syndrome)   ? Seasonal allergies   ? ? ?Past Surgical History:  ?Procedure Laterality Date  ? CYSTECTOMY    ? right  ? ? ?There were no vitals filed for this visit. ? ? Subjective Assessment - 08/30/21 0811   ? ? Subjective Patient reports that her elbows are about the same. She has been doing the exercises and her sister has been trying to stretch her arms once since patient was here last. Lt wrist is hurting from tendonitis in both wrists which "acts up every now and then"   ? Currently in Pain? Yes   ? Pain Score 3    8.5/10 at worst  ? Pain Location Arm   ? Pain Orientation Right;Left   ? Pain Descriptors / Indicators Aching;Nagging   ? Pain Type Chronic pain   ? Pain Onset More than a month ago   ? ?  ?  ? ?  ? ? ? ? ? [facility] PT Assessment - 08/30/21 0001   ? ?  ? Assessment  ? Medical Diagnosis Bilat arm pain   ? Referring Provider (PT) Dr Neena RhymesKatherine Tabori   ? Onset Date/Surgical Date 07/17/20   ? Hand Dominance Left   ? Next MD Visit 02/03/22   ? Prior Therapy none   ?  ? AROM  ? Right Elbow Extension -43   ? Left Elbow Extension -37    ? ?  ?  ? ?  ? ? ? ? ? ? ? ? ? ? ? ? ? ? ? ? [facility] Adult PT Treatment/Exercise - 08/30/21 0001   ? ?  ? Elbow Exercises  ? Elbow Extension AROM;Right;Left;10 reps;Supine   ? Forearm Supination AROM;Right;Left;5 reps;Supine;Seated   ?  ? Shoulder Exercises: Stretch  ? Table Stretch - Flexion 2 reps;10 seconds   ? Table Stretch -Flexion Limitations holding table to stretch into elbow extension   ?  ? Moist Heat Therapy  ? Number Minutes Moist Heat 10 Minutes   ? Moist Heat Location Elbow   bilat  ?  ? Manual Therapy  ? Manual therapy comments skilled palpation to assess response to DN and manual work   ? Soft tissue mobilization deep tissue work through the Rt/Lt biceps/triceps   ? Myofascial Release biceps/triceps   ? Passive ROM Rt/Lt elbow extension   ? ?  ?  ? ?  ? ? ? Trigger Point  Dry Needling - 08/30/21 0001   ? ? Consent Given? Yes   ? Education Handout Provided Yes   ? Dry Needling Comments bilat   ? Electrical Stimulation Performed with Dry Needling Yes   ? E-stim with Dry Needling Details mApm x 5 min   ? Biceps Response Palpable increased muscle length;Twitch response elicited   ? Triceps Response Palpable increased muscle length;Twitch response elicited   ? ?  ?  ? ?  ? ? ? ? ? ? ? ? PT Education - 08/30/21 0830   ? ? Education Details DN HEP   ? Person(s) Educated Patient   ? Methods Explanation;Demonstration;Tactile cues;Verbal cues;Handout   ? Comprehension Verbalized understanding;Returned demonstration;Verbal cues required;Tactile cues required   ? ?  ?  ? ?  ? ? ? ? ? ? PT Long Term Goals - 08/19/21 1005   ? ?  ? PT LONG TERM GOAL #1  ? Title Improve functional mobility, ROM, ADL's with bilat UE's allowing patient to use UE's more functionally   ? Time 6   ? Period Weeks   ? Status New   ? Target Date 09/30/21   ?  ? PT LONG TERM GOAL #2  ? Title Improve posture and alignment with patient to demonstrate increased activation and strength of postural musculature   ? Time 6   ? Period Weeks   ? Status  New   ? Target Date 09/30/21   ?  ? PT LONG TERM GOAL #3  ? Title Increase AROM bilat elbow extension to -10 to -15 degrees, elbow flexion to 125 degrees, supination to 75 to 80 degrees   ? Time 6   ? Period Weeks   ? Status New   ? Target Date 09/30/21   ?  ? PT LONG TERM GOAL #4  ? Title Independent in HEP   ? Time 6   ? Period Weeks   ? Status New   ? Target Date 09/30/21   ?  ? PT LONG TERM GOAL #5  ? Title Improve functional limitation score to 68   ? Time 6   ? Period Weeks   ? Status New   ? Target Date 09/30/21   ? ?  ?  ? ?  ? ? ? ? ? ? ? ? Plan - 08/30/21 0839   ? ? Clinical Impression Statement Patient reports no change in pain and stiffness in the elbows. She has tried to work on exercises at home. the forearm supination seems to irritate the wrist tendonitis she has experienced in the past. Added trial of DN and PROM/manual work bilat UE's. Reviewed and progressed HEP.   ? Rehab Potential Good   ? PT Frequency 2x / week   ? PT Duration 6 weeks   ? PT Treatment/Interventions ADLs/Self Care Home Management;Aquatic Therapy;Cryotherapy;Electrical Stimulation;Iontophoresis 4mg /ml Dexamethasone;Moist Heat;Ultrasound;Therapeutic activities;Therapeutic exercise;Neuromuscular re-education;Patient/family education;Manual techniques;Passive range of motion;Dry needling;Taping   ? PT Next Visit Plan review and progress with PROM, AROM, strengthening bilat UE's focus on elbow extension; assess response to DN and manual work to biceps/triceps; trial of TENS to elbows for pain management; manual work and PROM   ? PT Home Exercise Plan [email or web address]   ? Consulted and Agree with Plan of Care Patient   ? ?  ?  ? ?  ? ? ?Patient will benefit from skilled therapeutic intervention in order to improve the following deficits and impairments:    ? ?Visit Diagnosis: ?Bilateral arm pain ? ?Other  symptoms and signs involving the musculoskeletal system ? ?Muscle weakness (generalized) ? ?Abnormal posture ? ? ? ? ?Problem List ?Patient  Active Problem List  ? Diagnosis Date Noted  ? Hyperlipidemia 01/14/2021  ? Prediabetes 01/14/2021  ? Vitamin D deficiency 07/31/2017  ? Bilateral pulmonary embolism (HCC) 05/22/2017  ? Amenorrhea 02/12/2014  ? HTN (hypertension) 01/15/2014  ? Low back pain 08/09/2012  ? Edema 08/09/2012  ? Morbid obesity with BMI of 60.0-69.9, adult (HCC)   ? Seasonal allergies   ? PCOS (polycystic ovarian syndrome)   ? Oligomenorrhea   ? HSV-2 infection   ? Anovulation   ? Ovarian cyst   ? Knee pain 12/06/2011  ? General medical examination 09/27/2010  ? RHINITIS 08/08/2008  ? Asthma 08/08/2008  ? ? ?Val Riles, PT, MPH  ?08/30/2021, 9:09 AM ? ?[place] ?[facility] ?[address] ?[place], [place], [address] ?Phone: [phone]   Fax:  [phone] ? ?Name: Hamsini Verrilli ?MRN: [id number] ?Date of Birth: 1981-01-06 ? ? ? ?

## 2021-08-30 NOTE — Patient Instructions (Addendum)
Trigger Point Dry Needling ? ?What is Trigger Point Dry Needling (DN)? ?DN is a physical therapy technique used to treat muscle pain and dysfunction. Specifically, DN helps deactivate muscle trigger points (muscle knots).  ?A thin filiform needle is used to penetrate the skin and stimulate the underlying trigger point. The goal is for a local twitch response (LTR) to occur and for the trigger point to relax. No medication of any kind is injected during the procedure.  ? ?What Does Trigger Point Dry Needling Feel Like?  ?The procedure feels different for each individual patient. Some patients report that they do not actually feel the needle enter the skin and overall the process is not painful. Very mild bleeding may occur. However, many patients feel a deep cramping in the muscle in which the needle was inserted. This is the local twitch response.  ? ?How Will I feel after the treatment? ?Soreness is normal, and the onset of soreness may not occur for a few hours. Typically this soreness does not last longer than two days.  ?Bruising is uncommon, however; ice can be used to decrease any possible bruising.  ?In rare cases feeling tired or nauseous after the treatment is normal. In addition, your symptoms may get worse before they get better, this period will typically not last longer than 24 hours.  ? ?What Can I do After My Treatment? ?Increase your hydration by drinking more water for the next 24 hours. ?You may place ice or heat on the areas treated that have become sore, however, do not use heat on inflamed or bruised areas. Heat often brings more relief post needling. ?You can continue your regular activities, but vigorous activity is not recommended initially after the treatment for 24 hours. ?DN is best combined with other physical therapy such as strengthening, stretching, and other therapies.  ?Access Code: [email or web address] ?URL: [email or web address] ?Date: 08/30/2021 ?Prepared by: Corlis Leak ? ?Exercises ?- Bicep Stretch at Table  - 2 x daily - 7 x weekly - 1 sets - 3 reps - 30 sec  hold ?- Forearm Supination Stretch  - 2 x daily - 7 x weekly - 1 sets - 3-5 reps - 10 sec  hold ?- Supine Elbow Flexion Extension AROM  - 2 x daily - 7 x weekly - 2-3 sets - 10 reps - 5 sec  hold ?

## 2021-09-01 ENCOUNTER — Ambulatory Visit: Payer: BC Managed Care – PPO | Admitting: Rehabilitative and Restorative Service Providers"

## 2021-09-01 ENCOUNTER — Encounter: Payer: Self-pay | Admitting: Rehabilitative and Restorative Service Providers"

## 2021-09-01 DIAGNOSIS — R293 Abnormal posture: Secondary | ICD-10-CM | POA: Diagnosis not present

## 2021-09-01 DIAGNOSIS — M79602 Pain in left arm: Secondary | ICD-10-CM

## 2021-09-01 DIAGNOSIS — R29898 Other symptoms and signs involving the musculoskeletal system: Secondary | ICD-10-CM

## 2021-09-01 DIAGNOSIS — M6281 Muscle weakness (generalized): Secondary | ICD-10-CM

## 2021-09-01 DIAGNOSIS — M79601 Pain in right arm: Secondary | ICD-10-CM | POA: Diagnosis not present

## 2021-09-01 NOTE — Patient Instructions (Addendum)
Access Code: [email or web address] ?URL: [email or web address] ?Date: 09/01/2021 ?Prepared by: Gillermo Murdoch ? ?Exercises ?- Bicep Stretch at Table  - 2 x daily - 7 x weekly - 1 sets - 3 reps - 30 sec  hold ?- Forearm Supination Stretch  - 2 x daily - 7 x weekly - 1 sets - 3-5 reps - 10 sec  hold ?- Supine Elbow Flexion Extension AROM  - 2 x daily - 7 x weekly - 2-3 sets - 10 reps - 5 sec  hold ?- Wall Push Up  - 1 x daily - 7 x weekly - 1-3 sets - 10 reps - 3 sec  hold ?- Standing shoulder flexion wall slides  - 2 x daily - 7 x weekly - 1 sets - 10 reps - 10 sec  hold ?- Seated Cervical Retraction  - 2 x daily - 7 x weekly - 1-2 sets - 5-10 reps - 10 sec  hold ?- Seated Cervical Sidebending AROM  - 2 x daily - 7 x weekly - 1 sets - 5 reps - 5-10 sec  hold ?- Seated Cervical Rotation AROM  - 2 x daily - 7 x weekly - 1 sets - 5 reps - 2-3 sec  hold ?- Seated Scapular Retraction  - 2 x daily - 7 x weekly - 1-2 sets - 10 reps - 10 sec  hold ? ? ?[place] Exercise Resources ? ? ?Physiological scientist of services cost  ?[facility] [place], [place] ?([place] ? Aquatics, silver sneakers classes, yoga, exercise equipment $18/month for 55+ ?$25/month individual ?$40-$60/month family  ?[facility] [address] ?[place], [place] ?([place] Transportation to appointments. Activities (stretching, dancing, Tai Chi) ? Mostly free  ?[facility] [address] ?[place], [place] ?([place] ? Silver sneakers ?[place] ?Aquatics Fee based  ?healthwise exercise program Various locations ?[phone] ? Chair exercise free  ?[place] [facility] Various locations ?[phone] Yoga, Tai Chi, Step Aerobics, [employer], Table tennis, chair volleyball, petanque ? free  ?[facility] [place], [place] ? [phone] Various activities (drum  exercise, yoga, line dancing, etc) ? free  ? ?Aquatic Therapy: What to Expect! ? ?Where:  ?[facility] [employer] ?[address] ?[place], [place] [address] ?[phone]          ? ?How to Prepare: ? ?If you require assistance with dressing, with transportation (ie: wheel chair), or toileting, a caregiver must attend the entire session with you (unless your primary therapists feels this is not necessary).   ?If there is thunder during your appointment, you will be asked to leave pool area. You have the option to finish your session in the physical therapy area near the gym. ?Masks in the pool area are optional. Your face will remain dry during your session, so you are welcome to keep your mask on, if desired. You will be spaced at least 6 feet from other aquatic patients.  ?Please bring your own swim towel to dry off with.   ?There are Men's and Women's locker rooms with showers, as well as gender neutral bathrooms in the pool area.  ?Please arrive IN YOUR SUIT and a few minutes prior to your appointment - this helps to avoid delays in starting your session. ?Head to the pool and await your appointment on the bench on the pool deck. ?Please make sure  to attend to any toileting needs prior to entering the pool. ?Once on the pool deck your therapist will ask you to sign the Patient  Consent and Assignment of Benefits form. ?Your therapist may take your blood pressure prior to, during and after your session if indicated. ?We usually try and create a home exercise program based on activities we do in the pool. Some patients do not want to or do not have the ability to participate in an aquatic home program - this is not a barrier in any way to you participating in aquatic therapy as part of your current therapy plan! ? ?Appointments:  All sessions are 45 minutes ? ?About the pool: ?Entering the pool: ?Your therapist will assist you if needed; there are two ways to enter the pool - stairs or a  mechanical lift. Your therapist will determine the most appropriate way for you. ?Water temperature is usually around 91-95?[place]  There is a lap pool with a temperature around 84? ?There may be other therapists and patients in the pool at the same time.  ? ?Contact Info:            ?To cancel appointment, please call [facility] at [phone] ?If you are running late, please call [facility] at [phone]    ?        ?     ?  ? ? ?

## 2021-09-01 NOTE — Therapy (Addendum)
[place] [facility] [place], [place], [address] Phone: [phone]   Fax:  [phone]  Physical Therapy Treatment PHYSICAL THERAPY DISCHARGE SUMMARY  Visits from Start of Care: 3  Current functional level related to goals / functional outcomes: See progress note for discharge status   Remaining deficits: Unknown    Education / Equipment: HEP  Patient agrees to discharge. Patient goals were not met. Patient is being discharged due to not returning since the last visit.  Quincie Haroon P. Helene Kelp PT, MPH 10/13/21 3:52 PM  Patient Details  Name: Taylor Frye MRN: [id number] Date of Birth: 02/20/1981 Referring Provider (PT): Dr Annye Asa   Encounter Date: 09/01/2021   PT End of Session - 09/01/21 0851     Visit Number 3    Number of Visits 12    Date for PT Re-Evaluation 09/30/21    PT Start Time 0850    PT Stop Time 0935    PT Time Calculation (min) 45 min    Activity Tolerance Patient tolerated treatment well;Patient limited by pain             Past Medical History:  Diagnosis Date   Anovulation    Asthma    History of chicken pox    History of chicken pox    HSV-2 infection    Hypertension    Obesity    Obesity    Oligomenorrhea    Ovarian cyst    PCOS (polycystic ovarian syndrome)    Seasonal allergies     Past Surgical History:  Procedure Laterality Date   CYSTECTOMY     right    There were no vitals filed for this visit.   Subjective Assessment - 09/01/21 0852     Subjective Some soreness in the elbows following the last visits. Sister helped with stretching elbows yesterday. Feels that she can tell a difference in the elbow movement    Currently in Pain? No/denies    Pain Score 0-No pain    Pain Location Arm    Pain Orientation Right;Left    Pain Descriptors / Indicators Tightness;Sore    Pain Type Chronic pain    Pain Onset More than a month ago    Pain Frequency  Intermittent                               [facility] Adult PT Treatment/Exercise - 09/01/21 0001       Elbow Exercises   Elbow Extension AROM;Right;Left;10 reps;Supine    Forearm Supination AROM;Right;Left;5 reps;Supine;Seated      Shoulder Exercises: Seated   Retraction AROM;Strengthening;Both;5 reps    Retraction Limitations scap squeeze w/ coregeous ball T spine    Other Seated Exercises chin tuck 5 sec x 5 reps; lateral cervical flexoin 3 reps x 5 sec hold; cervical rotation 5 reps x 2-3 sec hold      Shoulder Exercises: Standing   Other Standing Exercises wall push up x 10 reps    Other Standing Exercises shoulder/elbow flexion rolling ball up wall x 10      Shoulder Exercises: Stretch   Table Stretch - Flexion 2 reps;10 seconds    Table Stretch -Flexion Limitations holding table to stretch into elbow extension      Moist Heat Therapy   Number Minutes Moist Heat 10 Minutes    Moist Heat Location Elbow   bilat     Electrical Stimulation   Electrical  Stimulation Location bilat arms    Electrical Stimulation Action TENS    Electrical Stimulation Parameters to tolerance    Electrical Stimulation Goals Tone;Pain      Manual Therapy   Soft tissue mobilization deep tissue work through the Rt/Lt biceps/triceps    Passive ROM Rt/Lt elbow extension                     PT Education - 09/01/21 0919     Education Details HEP aquatic therapy    Person(s) Educated Patient    Methods Explanation;Demonstration;Tactile cues;Verbal cues;Handout    Comprehension Verbalized understanding;Returned demonstration;Verbal cues required;Tactile cues required                 PT Long Term Goals - 08/19/21 1005       PT LONG TERM GOAL #1   Title Improve functional mobility, ROM, ADL's with bilat UE's allowing patient to use UE's more functionally    Time 6    Period Weeks    Status New    Target Date 09/30/21      PT LONG TERM GOAL #2   Title  Improve posture and alignment with patient to demonstrate increased activation and strength of postural musculature    Time 6    Period Weeks    Status New    Target Date 09/30/21      PT LONG TERM GOAL #3   Title Increase AROM bilat elbow extension to -10 to -15 degrees, elbow flexion to 125 degrees, supination to 75 to 80 degrees    Time 6    Period Weeks    Status New    Target Date 09/30/21      PT LONG TERM GOAL #4   Title Independent in HEP    Time 6    Period Weeks    Status New    Target Date 09/30/21      PT LONG TERM GOAL #5   Title Improve functional limitation score to 68    Time 6    Period Weeks    Status New    Target Date 09/30/21                   Plan - 09/01/21 0855     Clinical Impression Statement Less pain but continued stiffness and some soreness with exercises and passive stretch. Added AROM and stretching as well as postural work. Trial of TENS for home use. Discussed aquatic program.    Rehab Potential Good    PT Frequency 2x / week    PT Duration 6 weeks    PT Treatment/Interventions ADLs/Self Care Home Management;Aquatic Therapy;Cryotherapy;Electrical Stimulation;Iontophoresis 4mg /ml Dexamethasone;Moist Heat;Ultrasound;Therapeutic activities;Therapeutic exercise;Neuromuscular re-education;Patient/family education;Manual techniques;Passive range of motion;Dry needling;Taping    PT Next Visit Plan review and progress with PROM, AROM, strengthening bilat UE's focus on elbow extension; continue DN and manual work to biceps/triceps; assess trial of TENS to elbows for pain management; manual work and PROM    PT Home Exercise Plan [email or web address]    Consulted and Agree with Plan of Care Patient             Patient will benefit from skilled therapeutic intervention in order to improve the following deficits and impairments:     Visit Diagnosis: Bilateral arm pain  Other symptoms and signs involving the musculoskeletal system  Muscle weakness  (generalized)  Abnormal posture     Problem List Patient Active Problem List   Diagnosis Date Noted  Hyperlipidemia 01/14/2021   Prediabetes 01/14/2021   Vitamin D deficiency 07/31/2017   Bilateral pulmonary embolism ([place]) 05/22/2017   Amenorrhea 02/12/2014   HTN (hypertension) 01/15/2014   Low back pain 08/09/2012   Edema 08/09/2012   Morbid obesity with BMI of 60.0-69.9, adult (HCC)    Seasonal allergies    PCOS (polycystic ovarian syndrome)    Oligomenorrhea    HSV-2 infection    Anovulation    Ovarian cyst    Knee pain 12/06/2011   General medical examination 09/27/2010   RHINITIS 08/08/2008   Asthma 08/08/2008    Montay Vanvoorhis Nilda Simmer, PT, MPH 09/01/2021, 9:31 AM  [facility] [place], [place], [address] Phone: [phone]   Fax:  [phone]  Name: Taylor Frye MRN: [id number] Date of Birth: Dec 24, 1980

## 2021-09-06 ENCOUNTER — Ambulatory Visit: Payer: BC Managed Care – PPO | Admitting: Rehabilitative and Restorative Service Providers"

## 2021-09-07 DIAGNOSIS — F411 Generalized anxiety disorder: Secondary | ICD-10-CM | POA: Diagnosis not present

## 2021-09-08 ENCOUNTER — Other Ambulatory Visit: Payer: Self-pay | Admitting: Family Medicine

## 2021-09-08 ENCOUNTER — Ambulatory Visit: Payer: BC Managed Care – PPO | Admitting: Rehabilitative and Restorative Service Providers"

## 2021-09-08 ENCOUNTER — Telehealth: Payer: Self-pay | Admitting: Rehabilitative and Restorative Service Providers"

## 2021-09-08 NOTE — Telephone Encounter (Signed)
Taylor Frye failed to show for scheduled appointment. Left message asking that she call to schedule additional appointments as needed.  [name] P. Leonor Liv PT, MPH 09/08/21 9:18 AM

## 2021-09-09 DIAGNOSIS — I10 Essential (primary) hypertension: Secondary | ICD-10-CM | POA: Diagnosis not present

## 2021-09-09 DIAGNOSIS — I2699 Other pulmonary embolism without acute cor pulmonale: Secondary | ICD-10-CM | POA: Diagnosis not present

## 2021-09-09 DIAGNOSIS — G4733 Obstructive sleep apnea (adult) (pediatric): Secondary | ICD-10-CM | POA: Diagnosis not present

## 2021-09-09 DIAGNOSIS — E282 Polycystic ovarian syndrome: Secondary | ICD-10-CM | POA: Diagnosis not present

## 2021-09-18 DIAGNOSIS — M25532 Pain in left wrist: Secondary | ICD-10-CM | POA: Diagnosis not present

## 2021-09-18 DIAGNOSIS — M129 Arthropathy, unspecified: Secondary | ICD-10-CM | POA: Diagnosis not present

## 2021-09-18 DIAGNOSIS — Z79899 Other long term (current) drug therapy: Secondary | ICD-10-CM | POA: Diagnosis not present

## 2021-09-18 DIAGNOSIS — M25562 Pain in left knee: Secondary | ICD-10-CM | POA: Diagnosis not present

## 2021-09-18 DIAGNOSIS — M25561 Pain in right knee: Secondary | ICD-10-CM | POA: Diagnosis not present

## 2021-09-20 DIAGNOSIS — M25532 Pain in left wrist: Secondary | ICD-10-CM | POA: Diagnosis not present

## 2021-09-25 ENCOUNTER — Other Ambulatory Visit: Payer: Self-pay | Admitting: Family Medicine

## 2021-09-25 DIAGNOSIS — M25562 Pain in left knee: Secondary | ICD-10-CM | POA: Diagnosis not present

## 2021-09-25 DIAGNOSIS — R768 Other specified abnormal immunological findings in serum: Secondary | ICD-10-CM | POA: Diagnosis not present

## 2021-09-25 DIAGNOSIS — M25532 Pain in left wrist: Secondary | ICD-10-CM | POA: Diagnosis not present

## 2021-09-25 DIAGNOSIS — M25561 Pain in right knee: Secondary | ICD-10-CM | POA: Diagnosis not present

## 2021-10-01 DIAGNOSIS — M1990 Unspecified osteoarthritis, unspecified site: Secondary | ICD-10-CM | POA: Diagnosis not present

## 2021-10-01 DIAGNOSIS — I10 Essential (primary) hypertension: Secondary | ICD-10-CM | POA: Diagnosis not present

## 2021-10-01 DIAGNOSIS — M25532 Pain in left wrist: Secondary | ICD-10-CM | POA: Diagnosis not present

## 2021-10-15 DIAGNOSIS — I2699 Other pulmonary embolism without acute cor pulmonale: Secondary | ICD-10-CM | POA: Diagnosis not present

## 2021-10-15 DIAGNOSIS — I10 Essential (primary) hypertension: Secondary | ICD-10-CM | POA: Diagnosis not present

## 2021-10-15 DIAGNOSIS — M0579 Rheumatoid arthritis with rheumatoid factor of multiple sites without organ or systems involvement: Secondary | ICD-10-CM | POA: Diagnosis not present

## 2021-10-15 DIAGNOSIS — M25521 Pain in right elbow: Secondary | ICD-10-CM | POA: Diagnosis not present

## 2021-10-16 DIAGNOSIS — M24132 Other articular cartilage disorders, left wrist: Secondary | ICD-10-CM | POA: Diagnosis not present

## 2021-10-16 DIAGNOSIS — M65832 Other synovitis and tenosynovitis, left forearm: Secondary | ICD-10-CM | POA: Diagnosis not present

## 2021-10-16 DIAGNOSIS — M19032 Primary osteoarthritis, left wrist: Secondary | ICD-10-CM | POA: Diagnosis not present

## 2021-10-29 ENCOUNTER — Other Ambulatory Visit: Payer: Self-pay | Admitting: Family Medicine

## 2021-10-29 DIAGNOSIS — E785 Hyperlipidemia, unspecified: Secondary | ICD-10-CM

## 2021-11-01 DIAGNOSIS — F411 Generalized anxiety disorder: Secondary | ICD-10-CM | POA: Diagnosis not present

## 2021-11-09 DIAGNOSIS — M19042 Primary osteoarthritis, left hand: Secondary | ICD-10-CM | POA: Diagnosis not present

## 2021-11-09 DIAGNOSIS — M19022 Primary osteoarthritis, left elbow: Secondary | ICD-10-CM | POA: Diagnosis not present

## 2021-11-09 DIAGNOSIS — M25421 Effusion, right elbow: Secondary | ICD-10-CM | POA: Diagnosis not present

## 2021-11-09 DIAGNOSIS — M79642 Pain in left hand: Secondary | ICD-10-CM | POA: Diagnosis not present

## 2021-11-09 DIAGNOSIS — M19041 Primary osteoarthritis, right hand: Secondary | ICD-10-CM | POA: Diagnosis not present

## 2021-11-09 DIAGNOSIS — M79641 Pain in right hand: Secondary | ICD-10-CM | POA: Diagnosis not present

## 2021-11-09 DIAGNOSIS — M19021 Primary osteoarthritis, right elbow: Secondary | ICD-10-CM | POA: Diagnosis not present

## 2021-11-09 DIAGNOSIS — M25422 Effusion, left elbow: Secondary | ICD-10-CM | POA: Diagnosis not present

## 2021-11-09 DIAGNOSIS — M069 Rheumatoid arthritis, unspecified: Secondary | ICD-10-CM | POA: Diagnosis not present

## 2021-11-23 DIAGNOSIS — Z5181 Encounter for therapeutic drug level monitoring: Secondary | ICD-10-CM | POA: Diagnosis not present

## 2021-11-23 DIAGNOSIS — M0579 Rheumatoid arthritis with rheumatoid factor of multiple sites without organ or systems involvement: Secondary | ICD-10-CM | POA: Diagnosis not present

## 2021-11-23 DIAGNOSIS — I10 Essential (primary) hypertension: Secondary | ICD-10-CM | POA: Diagnosis not present

## 2021-11-25 DIAGNOSIS — E282 Polycystic ovarian syndrome: Secondary | ICD-10-CM | POA: Diagnosis not present

## 2021-11-25 DIAGNOSIS — G4733 Obstructive sleep apnea (adult) (pediatric): Secondary | ICD-10-CM | POA: Diagnosis not present

## 2021-11-25 DIAGNOSIS — I10 Essential (primary) hypertension: Secondary | ICD-10-CM | POA: Diagnosis not present

## 2021-12-17 DIAGNOSIS — E669 Obesity, unspecified: Secondary | ICD-10-CM | POA: Diagnosis not present

## 2021-12-17 DIAGNOSIS — Z9884 Bariatric surgery status: Secondary | ICD-10-CM | POA: Diagnosis not present

## 2021-12-20 ENCOUNTER — Other Ambulatory Visit: Payer: Self-pay | Admitting: Family Medicine

## 2021-12-31 ENCOUNTER — Other Ambulatory Visit: Payer: Self-pay | Admitting: Family Medicine

## 2022-01-06 DIAGNOSIS — Z713 Dietary counseling and surveillance: Secondary | ICD-10-CM | POA: Diagnosis not present

## 2022-01-11 DIAGNOSIS — Z7189 Other specified counseling: Secondary | ICD-10-CM | POA: Diagnosis not present

## 2022-01-11 DIAGNOSIS — Z6841 Body Mass Index (BMI) 40.0 and over, adult: Secondary | ICD-10-CM | POA: Diagnosis not present

## 2022-01-11 DIAGNOSIS — F54 Psychological and behavioral factors associated with disorders or diseases classified elsewhere: Secondary | ICD-10-CM | POA: Diagnosis not present

## 2022-01-25 ENCOUNTER — Encounter: Payer: Self-pay | Admitting: Family Medicine

## 2022-01-26 DIAGNOSIS — I10 Essential (primary) hypertension: Secondary | ICD-10-CM | POA: Diagnosis not present

## 2022-01-26 DIAGNOSIS — R634 Abnormal weight loss: Secondary | ICD-10-CM | POA: Diagnosis not present

## 2022-01-26 DIAGNOSIS — R7303 Prediabetes: Secondary | ICD-10-CM | POA: Diagnosis not present

## 2022-01-26 DIAGNOSIS — Z6841 Body Mass Index (BMI) 40.0 and over, adult: Secondary | ICD-10-CM | POA: Diagnosis not present

## 2022-02-03 ENCOUNTER — Telehealth: Payer: Self-pay | Admitting: Family Medicine

## 2022-02-03 ENCOUNTER — Ambulatory Visit: Payer: BC Managed Care – PPO | Admitting: Family Medicine

## 2022-02-03 ENCOUNTER — Encounter: Payer: Self-pay | Admitting: Family Medicine

## 2022-02-03 VITALS — BP 130/82 | HR 70 | Temp 98.1°F | Resp 17 | Ht 65.0 in | Wt 365.1 lb

## 2022-02-03 DIAGNOSIS — Z23 Encounter for immunization: Secondary | ICD-10-CM | POA: Diagnosis not present

## 2022-02-03 DIAGNOSIS — Z6841 Body Mass Index (BMI) 40.0 and over, adult: Secondary | ICD-10-CM

## 2022-02-03 DIAGNOSIS — E785 Hyperlipidemia, unspecified: Secondary | ICD-10-CM

## 2022-02-03 DIAGNOSIS — I10 Essential (primary) hypertension: Secondary | ICD-10-CM

## 2022-02-03 LAB — CBC WITH DIFFERENTIAL/PLATELET
Basophils Absolute: 0 10*3/uL (ref 0.0–0.1)
Basophils Relative: 0.4 % (ref 0.0–3.0)
Eosinophils Absolute: 0.1 10*3/uL (ref 0.0–0.7)
Eosinophils Relative: 1.5 % (ref 0.0–5.0)
HCT: 39.5 % (ref 36.0–46.0)
Hemoglobin: 13.1 g/dL (ref 12.0–15.0)
Lymphocytes Relative: 47.2 % — ABNORMAL HIGH (ref 12.0–46.0)
Lymphs Abs: 2.2 10*3/uL (ref 0.7–4.0)
MCHC: 33 g/dL (ref 30.0–36.0)
MCV: 89.5 fl (ref 78.0–100.0)
Monocytes Absolute: 0.6 10*3/uL (ref 0.1–1.0)
Monocytes Relative: 13 % — ABNORMAL HIGH (ref 3.0–12.0)
Neutro Abs: 1.7 10*3/uL (ref 1.4–7.7)
Neutrophils Relative %: 37.9 % — ABNORMAL LOW (ref 43.0–77.0)
Platelets: 337 10*3/uL (ref 150.0–400.0)
RBC: 4.42 Mil/uL (ref 3.87–5.11)
RDW: 15.3 % (ref 11.5–15.5)
WBC: 4.6 10*3/uL (ref 4.0–10.5)

## 2022-02-03 LAB — LIPID PANEL
Cholesterol: 140 mg/dL (ref 0–200)
HDL: 40.9 mg/dL (ref >39.00)
LDL Cholesterol: 73 mg/dL (ref 0–99)
NonHDL: 99.08
Total CHOL/HDL Ratio: 3
Triglycerides: 128 mg/dL (ref 0.0–149.0)
VLDL: 25.6 mg/dL (ref 0.0–40.0)

## 2022-02-03 LAB — BASIC METABOLIC PANEL
BUN: 13 mg/dL (ref 6–23)
CO2: 27 mEq/L (ref 19–32)
Calcium: 10 mg/dL (ref 8.4–10.5)
Chloride: 103 mEq/L (ref 96–112)
Creatinine, Ser: 0.83 mg/dL (ref 0.40–1.20)
GFR: 87.06 mL/min (ref >60.00)
Glucose, Bld: 82 mg/dL (ref 70–99)
Potassium: 3.5 mEq/L (ref 3.5–5.1)
Sodium: 137 mEq/L (ref 135–145)

## 2022-02-03 LAB — HEPATIC FUNCTION PANEL
ALT: 11 U/L (ref 0–35)
AST: 15 U/L (ref 0–37)
Albumin: 4 g/dL (ref 3.5–5.2)
Alkaline Phosphatase: 51 U/L (ref 39–117)
Bilirubin, Direct: 0 mg/dL (ref 0.0–0.3)
Total Bilirubin: 0.4 mg/dL (ref 0.2–1.2)
Total Protein: 7.8 g/dL (ref 6.0–8.3)

## 2022-02-03 LAB — HEMOGLOBIN A1C: Hgb A1c MFr Bld: 5.9 % (ref 4.6–6.5)

## 2022-02-03 LAB — TSH: TSH: 2.67 u[IU]/mL (ref 0.35–5.50)

## 2022-02-03 NOTE — Assessment & Plan Note (Signed)
Chronic problem.  On Crestor 10mg daily w/o difficulty.  Check labs.  Adjust meds prn  

## 2022-02-03 NOTE — Telephone Encounter (Signed)
Pt called stating the fax # is [phone]  Her forms need to be faxed to this location

## 2022-02-03 NOTE — Progress Notes (Signed)
   Subjective:    Patient ID: Taylor Frye, female    DOB: December 27, 1980, 41 y.o.   MRN: [id number]  HPI HTN- chronic problem, on Losartan 100mg  daily, HCTZ 12.5mg  daily.  No CP, SOB, HAs, visual changes, edema.  Hyperlipidemia- chronic problem, on Crestor 10mg  daily.  No abd pain, N/V.  Obesity- pt is down nearly 20 lbs since April.  Pt reports she has been moving a lot more.  Pt is moving forward w/ bariatric surgery and needs updated EKG  Review of Systems For ROS see HPI     Objective:   Physical Exam Vitals reviewed.  Constitutional:      General: She is not in acute distress.    Appearance: Normal appearance. She is well-developed. She is obese. She is not ill-appearing.  HENT:     Head: Normocephalic and atraumatic.  Eyes:     Conjunctiva/sclera: Conjunctivae normal.     Pupils: Pupils are equal, round, and reactive to light.  Neck:     Thyroid: No thyromegaly.  Cardiovascular:     Rate and Rhythm: Normal rate and regular rhythm.     Pulses: Normal pulses.     Heart sounds: Normal heart sounds. No murmur heard. Pulmonary:     Effort: Pulmonary effort is normal. No respiratory distress.     Breath sounds: Normal breath sounds.  Abdominal:     General: There is no distension.     Palpations: Abdomen is soft.     Tenderness: There is no abdominal tenderness.  Musculoskeletal:     Cervical back: Normal range of motion and neck supple.     Right lower leg: No edema.     Left lower leg: No edema.  Lymphadenopathy:     Cervical: No cervical adenopathy.  Skin:    General: Skin is warm and dry.  Neurological:     Mental Status: She is alert and oriented to person, place, and time.  Psychiatric:        Behavior: Behavior normal.           Assessment & Plan:

## 2022-02-03 NOTE — Telephone Encounter (Signed)
Form has been faxed and a copy has been placed in scan as well Left pt a vm stating form was faxed

## 2022-02-03 NOTE — Progress Notes (Signed)
Pt seen results via my chart  

## 2022-02-03 NOTE — Assessment & Plan Note (Signed)
Pt is down 20 lbs since April.  Applauded her efforts.  She intends to move forward w/ bariatric surgery- forms completed today and EKG done (WNL).  Will continue to follow.

## 2022-02-03 NOTE — Patient Instructions (Signed)
Schedule your complete physical for 6 months We'll notify you of your lab results and make any changes if needed Keep up the good work on healthy diet and regular physical activity- you're doing great!!! You can get both the Tdap and flu today.  You can also get your Enbrel shot but in order for the flu shot and Tdap to be more effective, if it's possible to wait on the Enbrel for a few days, that would be best. Call with any questions or concerns Happy Fall!!!

## 2022-02-03 NOTE — Assessment & Plan Note (Signed)
Chronic problem.  Currently well controlled on Losartan 100mg  daily and HCTZ 12.5mg  daily.  Check labs due to ARB and diuretic use but no anticipated med changes.  Will follow.

## 2022-02-08 NOTE — Telephone Encounter (Signed)
Form and EKG copy has been faxed and is in scan

## 2022-02-22 DIAGNOSIS — G8929 Other chronic pain: Secondary | ICD-10-CM | POA: Diagnosis not present

## 2022-02-22 DIAGNOSIS — Z6841 Body Mass Index (BMI) 40.0 and over, adult: Secondary | ICD-10-CM | POA: Diagnosis not present

## 2022-02-22 DIAGNOSIS — G4733 Obstructive sleep apnea (adult) (pediatric): Secondary | ICD-10-CM | POA: Diagnosis not present

## 2022-02-22 DIAGNOSIS — M25561 Pain in right knee: Secondary | ICD-10-CM | POA: Diagnosis not present

## 2022-02-22 DIAGNOSIS — I10 Essential (primary) hypertension: Secondary | ICD-10-CM | POA: Diagnosis not present

## 2022-02-22 DIAGNOSIS — J4530 Mild persistent asthma, uncomplicated: Secondary | ICD-10-CM | POA: Diagnosis not present

## 2022-02-22 DIAGNOSIS — M1711 Unilateral primary osteoarthritis, right knee: Secondary | ICD-10-CM | POA: Diagnosis not present

## 2022-02-22 DIAGNOSIS — M25562 Pain in left knee: Secondary | ICD-10-CM | POA: Diagnosis not present

## 2022-03-08 DIAGNOSIS — Z6841 Body Mass Index (BMI) 40.0 and over, adult: Secondary | ICD-10-CM | POA: Diagnosis not present

## 2022-03-08 DIAGNOSIS — I10 Essential (primary) hypertension: Secondary | ICD-10-CM | POA: Diagnosis not present

## 2022-03-08 DIAGNOSIS — R7303 Prediabetes: Secondary | ICD-10-CM | POA: Diagnosis not present

## 2022-03-11 DIAGNOSIS — R7303 Prediabetes: Secondary | ICD-10-CM | POA: Diagnosis not present

## 2022-03-11 DIAGNOSIS — Z9989 Dependence on other enabling machines and devices: Secondary | ICD-10-CM | POA: Diagnosis not present

## 2022-03-11 DIAGNOSIS — Z6841 Body Mass Index (BMI) 40.0 and over, adult: Secondary | ICD-10-CM | POA: Diagnosis not present

## 2022-03-11 DIAGNOSIS — I10 Essential (primary) hypertension: Secondary | ICD-10-CM | POA: Diagnosis not present

## 2022-03-11 DIAGNOSIS — G4733 Obstructive sleep apnea (adult) (pediatric): Secondary | ICD-10-CM | POA: Diagnosis not present

## 2022-03-14 DIAGNOSIS — I10 Essential (primary) hypertension: Secondary | ICD-10-CM | POA: Diagnosis not present

## 2022-03-14 DIAGNOSIS — M0579 Rheumatoid arthritis with rheumatoid factor of multiple sites without organ or systems involvement: Secondary | ICD-10-CM | POA: Diagnosis not present

## 2022-04-07 ENCOUNTER — Other Ambulatory Visit: Payer: Self-pay | Admitting: Family Medicine

## 2022-04-07 DIAGNOSIS — E785 Hyperlipidemia, unspecified: Secondary | ICD-10-CM

## 2022-04-14 DIAGNOSIS — M0579 Rheumatoid arthritis with rheumatoid factor of multiple sites without organ or systems involvement: Secondary | ICD-10-CM | POA: Diagnosis not present

## 2022-04-14 DIAGNOSIS — Z5181 Encounter for therapeutic drug level monitoring: Secondary | ICD-10-CM | POA: Diagnosis not present

## 2022-04-25 DIAGNOSIS — M0579 Rheumatoid arthritis with rheumatoid factor of multiple sites without organ or systems involvement: Secondary | ICD-10-CM | POA: Diagnosis not present

## 2022-04-25 DIAGNOSIS — Z5181 Encounter for therapeutic drug level monitoring: Secondary | ICD-10-CM | POA: Diagnosis not present

## 2022-05-02 DIAGNOSIS — I10 Essential (primary) hypertension: Secondary | ICD-10-CM | POA: Diagnosis not present

## 2022-05-02 DIAGNOSIS — G4733 Obstructive sleep apnea (adult) (pediatric): Secondary | ICD-10-CM | POA: Diagnosis not present

## 2022-05-02 DIAGNOSIS — E785 Hyperlipidemia, unspecified: Secondary | ICD-10-CM | POA: Diagnosis not present

## 2022-05-02 DIAGNOSIS — Z01818 Encounter for other preprocedural examination: Secondary | ICD-10-CM | POA: Diagnosis not present

## 2022-05-19 ENCOUNTER — Encounter: Payer: Self-pay | Admitting: Family Medicine

## 2022-05-26 DIAGNOSIS — I10 Essential (primary) hypertension: Secondary | ICD-10-CM | POA: Diagnosis not present

## 2022-05-26 DIAGNOSIS — R634 Abnormal weight loss: Secondary | ICD-10-CM | POA: Diagnosis not present

## 2022-05-26 DIAGNOSIS — Z6841 Body Mass Index (BMI) 40.0 and over, adult: Secondary | ICD-10-CM | POA: Diagnosis not present

## 2022-05-30 DIAGNOSIS — Z7901 Long term (current) use of anticoagulants: Secondary | ICD-10-CM | POA: Diagnosis not present

## 2022-05-30 DIAGNOSIS — G8918 Other acute postprocedural pain: Secondary | ICD-10-CM | POA: Diagnosis not present

## 2022-05-30 DIAGNOSIS — Z6841 Body Mass Index (BMI) 40.0 and over, adult: Secondary | ICD-10-CM | POA: Diagnosis not present

## 2022-05-30 DIAGNOSIS — E785 Hyperlipidemia, unspecified: Secondary | ICD-10-CM | POA: Diagnosis not present

## 2022-05-30 DIAGNOSIS — G4733 Obstructive sleep apnea (adult) (pediatric): Secondary | ICD-10-CM | POA: Diagnosis not present

## 2022-05-30 DIAGNOSIS — J45909 Unspecified asthma, uncomplicated: Secondary | ICD-10-CM | POA: Diagnosis not present

## 2022-05-30 DIAGNOSIS — Z79899 Other long term (current) drug therapy: Secondary | ICD-10-CM | POA: Diagnosis not present

## 2022-05-30 DIAGNOSIS — I10 Essential (primary) hypertension: Secondary | ICD-10-CM | POA: Diagnosis not present

## 2022-05-30 DIAGNOSIS — Z86718 Personal history of other venous thrombosis and embolism: Secondary | ICD-10-CM | POA: Diagnosis not present

## 2022-05-30 DIAGNOSIS — E282 Polycystic ovarian syndrome: Secondary | ICD-10-CM | POA: Diagnosis not present

## 2022-05-30 DIAGNOSIS — M1990 Unspecified osteoarthritis, unspecified site: Secondary | ICD-10-CM | POA: Diagnosis not present

## 2022-05-30 HISTORY — PX: GASTRIC BYPASS: SHX52

## 2022-06-15 DIAGNOSIS — Z1330 Encounter for screening examination for mental health and behavioral disorders, unspecified: Secondary | ICD-10-CM | POA: Diagnosis not present

## 2022-06-15 DIAGNOSIS — Z6841 Body Mass Index (BMI) 40.0 and over, adult: Secondary | ICD-10-CM | POA: Diagnosis not present

## 2022-06-16 ENCOUNTER — Encounter: Payer: Self-pay | Admitting: Family Medicine

## 2022-06-16 ENCOUNTER — Ambulatory Visit: Payer: BC Managed Care – PPO | Admitting: Family Medicine

## 2022-06-16 MED ORDER — RIVAROXABAN 20 MG PO TABS: 20.0000 mg | ORAL_TABLET | Freq: Every day | ORAL | 1 refills | Status: DC

## 2022-06-16 NOTE — Assessment & Plan Note (Signed)
Pt had gastric bypass on 05/30/22  In just 2 weeks she is down 20 lbs.  She reports feeling good and optimistic about becoming more active and losing more weight.  Currently on chewable MVI and calcium.    Applauded her efforts at taking control of her health and encouraged her to keep it up.  Will follow.

## 2022-06-16 NOTE — Patient Instructions (Signed)
Follow up as needed or as scheduled Keep up the good work!  You're doing GREAT!!! Call with any questions or concerns Stay Safe!  Stay Healthy!!

## 2022-06-16 NOTE — Progress Notes (Signed)
   Subjective:    Patient ID: Taylor Frye, female    DOB: 1981/02/05, 42 y.o.   MRN: [id number]  HPI S/p gastric bypass- pt had surgery on 05/30/22.  Down 20 lbs.  Pt reports feeling 'pretty good'.  Had f/u yesterday and diet was expanded to pureed foods.  Has been on liquid diet for the last 4 weeks (2 pre-op, 2 post-op)  Pt has been cleared to bathe from a surgical perspective.  Pt is excited 'to get moving and keep losing more weight'.  Not having any nausea or vomiting.  Currently taking chewable MVI and Ca.  No CP, SOB, swelling.   Review of Systems For ROS see HPI     Objective:   Physical Exam Vitals reviewed.  Constitutional:      General: She is not in acute distress.    Appearance: Normal appearance. She is well-developed. She is obese. She is not ill-appearing.  HENT:     Head: Normocephalic and atraumatic.  Eyes:     Conjunctiva/sclera: Conjunctivae normal.     Pupils: Pupils are equal, round, and reactive to light.  Neck:     Thyroid: No thyromegaly.  Cardiovascular:     Rate and Rhythm: Normal rate and regular rhythm.     Heart sounds: Normal heart sounds. No murmur heard. Pulmonary:     Effort: Pulmonary effort is normal. No respiratory distress.     Breath sounds: Normal breath sounds.  Abdominal:     General: There is no distension.     Palpations: Abdomen is soft.     Tenderness: There is no abdominal tenderness.  Musculoskeletal:     Cervical back: Normal range of motion and neck supple.  Lymphadenopathy:     Cervical: No cervical adenopathy.  Skin:    General: Skin is warm and dry.  Neurological:     Mental Status: She is alert and oriented to person, place, and time.  Psychiatric:        Behavior: Behavior normal.           Assessment & Plan:

## 2022-07-01 ENCOUNTER — Other Ambulatory Visit: Payer: Self-pay | Admitting: Family Medicine

## 2022-07-04 ENCOUNTER — Other Ambulatory Visit: Payer: Self-pay | Admitting: Family Medicine

## 2022-07-04 DIAGNOSIS — E785 Hyperlipidemia, unspecified: Secondary | ICD-10-CM

## 2022-07-12 DIAGNOSIS — M0579 Rheumatoid arthritis with rheumatoid factor of multiple sites without organ or systems involvement: Secondary | ICD-10-CM | POA: Diagnosis not present

## 2022-07-12 DIAGNOSIS — I10 Essential (primary) hypertension: Secondary | ICD-10-CM | POA: Diagnosis not present

## 2022-07-12 DIAGNOSIS — Z79899 Other long term (current) drug therapy: Secondary | ICD-10-CM | POA: Diagnosis not present

## 2022-07-12 DIAGNOSIS — D84821 Immunodeficiency due to drugs: Secondary | ICD-10-CM | POA: Diagnosis not present

## 2022-07-18 ENCOUNTER — Encounter: Payer: BC Managed Care – PPO | Admitting: Family Medicine

## 2022-08-01 ENCOUNTER — Ambulatory Visit (INDEPENDENT_AMBULATORY_CARE_PROVIDER_SITE_OTHER): Payer: BC Managed Care – PPO | Admitting: Family Medicine

## 2022-08-01 ENCOUNTER — Encounter: Payer: Self-pay | Admitting: Family Medicine

## 2022-08-01 VITALS — BP 130/72 | HR 74 | Temp 98.8°F | Resp 17 | Ht 65.5 in | Wt 340.5 lb

## 2022-08-01 DIAGNOSIS — E559 Vitamin D deficiency, unspecified: Secondary | ICD-10-CM | POA: Diagnosis not present

## 2022-08-01 DIAGNOSIS — Z0000 Encounter for general adult medical examination without abnormal findings: Secondary | ICD-10-CM | POA: Diagnosis not present

## 2022-08-01 NOTE — Progress Notes (Signed)
   Subjective:    Patient ID: Taylor Frye, female    DOB: 1980-10-13, 42 y.o.   MRN: [id number]  HPI CPE- is in process of changing GYN (due for pap and mammo)  UTD on Tdap.  Patient Care Team    Relationship Specialty Notifications Start End  Sheliah Hatch, MD PCP - General   03/31/10   Hal Morales, MD (Inactive) Consulting Physician Obstetrics and Gynecology  07/15/15   Hoover Browns, MD Consulting Physician Obstetrics and Gynecology  07/14/20     Health Maintenance  Topic Date Due   PAP SMEAR-Modifier  05/25/2022   INFLUENZA VACCINE  11/17/2022   DTaP/Tdap/Td (3 - Td or Tdap) 02/04/2032   Hepatitis C Screening  Completed   HIV Screening  Completed   HPV VACCINES  Aged Out   COVID-19 Vaccine  Discontinued      Review of Systems Patient reports no vision/ hearing changes, adenopathy,fever, persistant/recurrent hoarseness , swallowing issues, chest pain, palpitations, edema, persistant/recurrent cough, hemoptysis, dyspnea (rest/exertional/paroxysmal nocturnal), gastrointestinal bleeding (melena, rectal bleeding), abdominal pain, significant heartburn, bowel changes, GU symptoms (dysuria, hematuria, incontinence), Gyn symptoms (abnormal  bleeding, pain),  syncope, focal weakness, memory loss, numbness & tingling, skin/hair/nail changes, abnormal bruising or bleeding, anxiety, or depression.   + 7 lb weight loss    Objective:   Physical Exam General Appearance:    Alert, cooperative, no distress, appears stated age, obese  Head:    Normocephalic, without obvious abnormality, atraumatic  Eyes:    PERRL, conjunctiva/corneas clear, EOM's intact both eyes  Ears:    Normal TM's and external ear canals, both ears  Nose:   Nares normal, septum midline, mucosa normal, no drainage    or sinus tenderness  Throat:   Lips, mucosa, and tongue normal; teeth and gums normal  Neck:   Supple, symmetrical, trachea midline, no adenopathy;    Thyroid: no enlargement/tenderness/nodules   Back:     Symmetric, no curvature, ROM normal, no CVA tenderness  Lungs:     Clear to auscultation bilaterally, respirations unlabored  Chest Wall:    No tenderness or deformity   Heart:    Regular rate and rhythm, S1 and S2 normal, no murmur, rub   or gallop  Breast Exam:    Deferred to GYN  Abdomen:     Soft, non-tender, bowel sounds active all four quadrants,    no masses, no organomegaly  Genitalia:    Deferred to GYN  Rectal:    Extremities:   Extremities normal, atraumatic, no cyanosis or edema  Pulses:   2+ and symmetric all extremities  Skin:   Skin color, texture, turgor normal, no rashes or lesions  Lymph nodes:   Cervical, supraclavicular, and axillary nodes normal  Neurologic:   CNII-XII intact, normal strength, sensation and reflexes    throughout          Assessment & Plan:

## 2022-08-01 NOTE — Assessment & Plan Note (Signed)
Ongoing issue for pt.  She is down 7 more lbs since gastric bypass.  Applauded her efforts.  Check labs to risk stratify.  Will follow.

## 2022-08-01 NOTE — Assessment & Plan Note (Signed)
Check labs and replete prn. 

## 2022-08-01 NOTE — Patient Instructions (Signed)
Follow up in 6 months to recheck BP and cholesterol We'll notify you of your lab results and make any changes if needed Keep up the good work on healthy diet and regular exercise- you look great!! Call and schedule an appt for pap and mammo w/ GYN and have them send me a copy Call with any questions or concerns Stay Safe!  Stay Healthy! Happy Spring!!!

## 2022-08-01 NOTE — Assessment & Plan Note (Signed)
Pt's PE WNL w/ exception of BMI.  Due for pap and mammo- pt is switching GYNs and will call to schedule.  Check labs.  Anticipatory guidance provided.

## 2022-08-02 LAB — CBC WITH DIFFERENTIAL/PLATELET
Basophils Absolute: 0 10*3/uL (ref 0.0–0.1)
Basophils Relative: 0.6 % (ref 0.0–3.0)
Eosinophils Absolute: 0 10*3/uL (ref 0.0–0.7)
Eosinophils Relative: 1.3 % (ref 0.0–5.0)
HCT: 39.1 % (ref 36.0–46.0)
Hemoglobin: 13.1 g/dL (ref 12.0–15.0)
Lymphocytes Relative: 43.1 % (ref 12.0–46.0)
Lymphs Abs: 1.2 10*3/uL (ref 0.7–4.0)
MCHC: 33.6 g/dL (ref 30.0–36.0)
MCV: 94.8 fl (ref 78.0–100.0)
Monocytes Absolute: 0.6 10*3/uL (ref 0.1–1.0)
Monocytes Relative: 21.9 % — ABNORMAL HIGH (ref 3.0–12.0)
Neutro Abs: 0.9 10*3/uL — ABNORMAL LOW (ref 1.4–7.7)
Neutrophils Relative %: 33.1 % — ABNORMAL LOW (ref 43.0–77.0)
Platelets: 279 10*3/uL (ref 150.0–400.0)
RBC: 4.13 Mil/uL (ref 3.87–5.11)
RDW: 15 % (ref 11.5–15.5)
WBC: 2.8 10*3/uL — ABNORMAL LOW (ref 4.0–10.5)

## 2022-08-02 LAB — BASIC METABOLIC PANEL
BUN: 13 mg/dL (ref 6–23)
CO2: 26 mEq/L (ref 19–32)
Calcium: 9.3 mg/dL (ref 8.4–10.5)
Chloride: 105 mEq/L (ref 96–112)
Creatinine, Ser: 0.62 mg/dL (ref 0.40–1.20)
GFR: 109.59 mL/min (ref >60.00)
Glucose, Bld: 81 mg/dL (ref 70–99)
Potassium: 4.2 mEq/L (ref 3.5–5.1)
Sodium: 136 mEq/L (ref 135–145)

## 2022-08-02 LAB — LIPID PANEL
Cholesterol: 97 mg/dL (ref 0–200)
HDL: 28.8 mg/dL — ABNORMAL LOW (ref >39.00)
LDL Cholesterol: 44 mg/dL (ref 0–99)
NonHDL: 68.47
Total CHOL/HDL Ratio: 3
Triglycerides: 121 mg/dL (ref 0.0–149.0)
VLDL: 24.2 mg/dL (ref 0.0–40.0)

## 2022-08-02 LAB — HEPATIC FUNCTION PANEL
ALT: 13 U/L (ref 0–35)
AST: 19 U/L (ref 0–37)
Albumin: 3.9 g/dL (ref 3.5–5.2)
Alkaline Phosphatase: 46 U/L (ref 39–117)
Bilirubin, Direct: 0 mg/dL (ref 0.0–0.3)
Total Bilirubin: 0.3 mg/dL (ref 0.2–1.2)
Total Protein: 7.4 g/dL (ref 6.0–8.3)

## 2022-08-02 LAB — VITAMIN D 25 HYDROXY (VIT D DEFICIENCY, FRACTURES): VITD: 40.17 ng/mL (ref 30.00–100.00)

## 2022-08-02 LAB — HEMOGLOBIN A1C: Hgb A1c MFr Bld: 5.1 % (ref 4.6–6.5)

## 2022-08-02 LAB — TSH: TSH: 1.78 u[IU]/mL (ref 0.35–5.50)

## 2022-08-03 ENCOUNTER — Telehealth: Payer: Self-pay

## 2022-08-03 NOTE — Telephone Encounter (Signed)
-----   Message from Sheliah Hatch, MD sent at 08/03/2022  7:31 AM EDT ----- Labs look great!  Keep up the good work!

## 2022-08-03 NOTE — Telephone Encounter (Signed)
Pt seen results Via my chart  

## 2022-08-22 ENCOUNTER — Other Ambulatory Visit: Payer: Self-pay | Admitting: Family Medicine

## 2022-08-23 DIAGNOSIS — J4530 Mild persistent asthma, uncomplicated: Secondary | ICD-10-CM | POA: Diagnosis not present

## 2022-08-23 DIAGNOSIS — Z6841 Body Mass Index (BMI) 40.0 and over, adult: Secondary | ICD-10-CM | POA: Diagnosis not present

## 2022-08-23 DIAGNOSIS — I10 Essential (primary) hypertension: Secondary | ICD-10-CM | POA: Diagnosis not present

## 2022-08-23 DIAGNOSIS — G4733 Obstructive sleep apnea (adult) (pediatric): Secondary | ICD-10-CM | POA: Diagnosis not present

## 2022-08-31 ENCOUNTER — Other Ambulatory Visit: Payer: Self-pay

## 2022-08-31 DIAGNOSIS — Z9884 Bariatric surgery status: Secondary | ICD-10-CM | POA: Diagnosis not present

## 2022-08-31 DIAGNOSIS — R634 Abnormal weight loss: Secondary | ICD-10-CM | POA: Diagnosis not present

## 2022-09-09 DIAGNOSIS — Z9884 Bariatric surgery status: Secondary | ICD-10-CM | POA: Diagnosis not present

## 2022-09-09 DIAGNOSIS — R634 Abnormal weight loss: Secondary | ICD-10-CM | POA: Diagnosis not present

## 2022-10-05 ENCOUNTER — Other Ambulatory Visit: Payer: Self-pay | Admitting: Family Medicine

## 2022-10-05 DIAGNOSIS — M1711 Unilateral primary osteoarthritis, right knee: Secondary | ICD-10-CM | POA: Diagnosis not present

## 2022-10-05 DIAGNOSIS — Z6841 Body Mass Index (BMI) 40.0 and over, adult: Secondary | ICD-10-CM | POA: Diagnosis not present

## 2022-10-05 DIAGNOSIS — I10 Essential (primary) hypertension: Secondary | ICD-10-CM | POA: Diagnosis not present

## 2022-11-16 ENCOUNTER — Other Ambulatory Visit: Payer: Self-pay | Admitting: Family Medicine

## 2022-12-01 DIAGNOSIS — Z79899 Other long term (current) drug therapy: Secondary | ICD-10-CM | POA: Diagnosis not present

## 2022-12-01 DIAGNOSIS — I10 Essential (primary) hypertension: Secondary | ICD-10-CM | POA: Diagnosis not present

## 2022-12-01 DIAGNOSIS — Z9884 Bariatric surgery status: Secondary | ICD-10-CM | POA: Diagnosis not present

## 2022-12-01 DIAGNOSIS — M0579 Rheumatoid arthritis with rheumatoid factor of multiple sites without organ or systems involvement: Secondary | ICD-10-CM | POA: Diagnosis not present

## 2022-12-21 ENCOUNTER — Other Ambulatory Visit: Payer: Self-pay | Admitting: Family Medicine

## 2022-12-21 DIAGNOSIS — E785 Hyperlipidemia, unspecified: Secondary | ICD-10-CM

## 2022-12-28 ENCOUNTER — Other Ambulatory Visit: Payer: Self-pay

## 2022-12-28 ENCOUNTER — Telehealth: Payer: Self-pay | Admitting: Family Medicine

## 2022-12-28 MED ORDER — QVAR REDIHALER 80 MCG/ACT IN AERB: 1.0000 | INHALATION_SPRAY | Freq: Two times a day (BID) | RESPIRATORY_TRACT | 0 refills | Status: DC

## 2022-12-28 NOTE — Telephone Encounter (Signed)
Refill sent in

## 2022-12-28 NOTE — Telephone Encounter (Signed)
Encourage patient to contact the pharmacy for refills or they can request refills through [facility]     WHAT PHARMACY WOULD THEY LIKE THIS SENT TO:  [facility] - [place], [place] - [address], [place] [address]   MEDICATION NAME & DOSE: QVAR REDIHALER 80 MCG/ACT inhaler   NOTES/COMMENTS FROM PATIENT:      Front office please notify patient: It takes 48-72 hours to process rx refill requests Ask patient to call pharmacy to ensure rx is ready before heading there.

## 2023-01-04 ENCOUNTER — Other Ambulatory Visit: Payer: Self-pay | Admitting: Family Medicine

## 2023-01-04 NOTE — Telephone Encounter (Signed)
Last office visit 10/06/2022 Last refill 08/01/2022

## 2023-01-06 DIAGNOSIS — I10 Essential (primary) hypertension: Secondary | ICD-10-CM | POA: Diagnosis not present

## 2023-01-06 DIAGNOSIS — Z6841 Body Mass Index (BMI) 40.0 and over, adult: Secondary | ICD-10-CM | POA: Diagnosis not present

## 2023-01-06 DIAGNOSIS — M1711 Unilateral primary osteoarthritis, right knee: Secondary | ICD-10-CM | POA: Diagnosis not present

## 2023-01-19 DIAGNOSIS — R634 Abnormal weight loss: Secondary | ICD-10-CM | POA: Diagnosis not present

## 2023-01-19 DIAGNOSIS — Z9884 Bariatric surgery status: Secondary | ICD-10-CM | POA: Diagnosis not present

## 2023-01-25 ENCOUNTER — Other Ambulatory Visit: Payer: Self-pay | Admitting: Family Medicine

## 2023-01-26 NOTE — Telephone Encounter (Signed)
Medication: Xarelto 20 Directions: Take 1 tablet by mouth daily with supper  Last given: 06/16/22 Number refills: 1 Last o/v: 08/01/22 Follow up: 02/01/23 Labs: 08/01/22

## 2023-01-31 ENCOUNTER — Ambulatory Visit: Payer: BC Managed Care – PPO | Admitting: Family Medicine

## 2023-02-01 ENCOUNTER — Ambulatory Visit (INDEPENDENT_AMBULATORY_CARE_PROVIDER_SITE_OTHER): Payer: BC Managed Care – PPO | Admitting: Family Medicine

## 2023-02-01 ENCOUNTER — Encounter: Payer: Self-pay | Admitting: Family Medicine

## 2023-02-01 VITALS — BP 124/74 | HR 51 | Temp 97.8°F | Ht 65.5 in | Wt 289.0 lb

## 2023-02-01 DIAGNOSIS — Z23 Encounter for immunization: Secondary | ICD-10-CM | POA: Diagnosis not present

## 2023-02-01 DIAGNOSIS — I10 Essential (primary) hypertension: Secondary | ICD-10-CM

## 2023-02-01 DIAGNOSIS — E785 Hyperlipidemia, unspecified: Secondary | ICD-10-CM

## 2023-02-01 LAB — CBC WITH DIFFERENTIAL/PLATELET
Basophils Absolute: 0 10*3/uL (ref 0.0–0.1)
Basophils Relative: 0.4 % (ref 0.0–3.0)
Eosinophils Absolute: 0.1 10*3/uL (ref 0.0–0.7)
Eosinophils Relative: 2.3 % (ref 0.0–5.0)
HCT: 40.4 % (ref 36.0–46.0)
Hemoglobin: 13.4 g/dL (ref 12.0–15.0)
Lymphocytes Relative: 56.8 % — ABNORMAL HIGH (ref 12.0–46.0)
Lymphs Abs: 1.3 10*3/uL (ref 0.7–4.0)
MCHC: 33 g/dL (ref 30.0–36.0)
MCV: 97.3 fL (ref 78.0–100.0)
Monocytes Absolute: 0.3 10*3/uL (ref 0.1–1.0)
Monocytes Relative: 11.4 % (ref 3.0–12.0)
Neutro Abs: 0.7 10*3/uL — ABNORMAL LOW (ref 1.4–7.7)
Neutrophils Relative %: 29.1 % — ABNORMAL LOW (ref 43.0–77.0)
Platelets: 228 10*3/uL (ref 150.0–400.0)
RBC: 4.16 Mil/uL (ref 3.87–5.11)
RDW: 15.5 % (ref 11.5–15.5)
WBC: 2.3 10*3/uL — ABNORMAL LOW (ref 4.0–10.5)

## 2023-02-01 LAB — LIPID PANEL
Cholesterol: 131 mg/dL (ref 0–200)
HDL: 45.2 mg/dL (ref >39.00)
LDL Cholesterol: 67 mg/dL (ref 0–99)
NonHDL: 85.94
Total CHOL/HDL Ratio: 3
Triglycerides: 97 mg/dL (ref 0.0–149.0)
VLDL: 19.4 mg/dL (ref 0.0–40.0)

## 2023-02-01 LAB — BASIC METABOLIC PANEL
BUN: 16 mg/dL (ref 6–23)
CO2: 25 meq/L (ref 19–32)
Calcium: 9.5 mg/dL (ref 8.4–10.5)
Chloride: 106 meq/L (ref 96–112)
Creatinine, Ser: 0.7 mg/dL (ref 0.40–1.20)
GFR: 106.06 mL/min (ref >60.00)
Glucose, Bld: 78 mg/dL (ref 70–99)
Potassium: 3.8 meq/L (ref 3.5–5.1)
Sodium: 138 meq/L (ref 135–145)

## 2023-02-01 LAB — HEPATIC FUNCTION PANEL
ALT: 24 U/L (ref 0–35)
AST: 27 U/L (ref 0–37)
Albumin: 4 g/dL (ref 3.5–5.2)
Alkaline Phosphatase: 74 U/L (ref 39–117)
Bilirubin, Direct: 0.1 mg/dL (ref 0.0–0.3)
Total Bilirubin: 0.4 mg/dL (ref 0.2–1.2)
Total Protein: 7.4 g/dL (ref 6.0–8.3)

## 2023-02-01 LAB — TSH: TSH: 1.58 u[IU]/mL (ref 0.35–5.50)

## 2023-02-01 MED ORDER — ALBUTEROL SULFATE HFA 108 (90 BASE) MCG/ACT IN AERS: 2.0000 | INHALATION_SPRAY | Freq: Four times a day (QID) | RESPIRATORY_TRACT | 0 refills | Status: DC | PRN

## 2023-02-01 NOTE — Assessment & Plan Note (Signed)
Chronic problem.  Well controlled on Losartan 100mg  daily.  Asymptomatic at this time.  Check labs due to ARB use.  No anticipated med changes.

## 2023-02-01 NOTE — Assessment & Plan Note (Signed)
Chronic problem.  Tolerating Crestor 10mg  daily w/o difficulty.  Check labs.  Adjust meds prn

## 2023-02-01 NOTE — Patient Instructions (Signed)
Schedule your complete physical in 6 months We'll notify you of your lab results and make any changes if needed Keep up the good work on healthy diet and regular exercise- you're doing AMAZING!!! Call with any questions or concerns Stay Safe!  Stay Healthy! Happy Fall!!!

## 2023-02-01 NOTE — Progress Notes (Signed)
   Subjective:    Patient ID: Taylor Frye, female    DOB: 01-16-1981, 42 y.o.   MRN: [id number]  HPI HTN- chronic problem, on Losartan 100mg  daily.  No CP, SOB, HA's, visual changes.  Hyperlipidemia- chronic problem, on Crestor 10mg  daily.  No abd pain, N/V.  Obesity- pt is down 52 lbs since April.  S/p gastric bypass 05/30/22.  Pt is now exercising- walking 3x/week.  Has dumbells at home to lift daily.   Review of Systems For ROS see HPI     Objective:   Physical Exam Vitals reviewed.  Constitutional:      General: She is not in acute distress.    Appearance: Normal appearance. She is well-developed. She is obese. She is not ill-appearing.  HENT:     Head: Normocephalic and atraumatic.  Eyes:     Conjunctiva/sclera: Conjunctivae normal.     Pupils: Pupils are equal, round, and reactive to light.  Neck:     Thyroid: No thyromegaly.  Cardiovascular:     Rate and Rhythm: Normal rate and regular rhythm.     Pulses: Normal pulses.     Heart sounds: Normal heart sounds. No murmur heard. Pulmonary:     Effort: Pulmonary effort is normal. No respiratory distress.     Breath sounds: Normal breath sounds.  Abdominal:     General: There is no distension.     Palpations: Abdomen is soft.     Tenderness: There is no abdominal tenderness.  Musculoskeletal:     Cervical back: Normal range of motion and neck supple.     Right lower leg: No edema.     Left lower leg: No edema.  Lymphadenopathy:     Cervical: No cervical adenopathy.  Skin:    General: Skin is warm and dry.  Neurological:     General: No focal deficit present.     Mental Status: She is alert and oriented to person, place, and time.  Psychiatric:        Mood and Affect: Mood normal.        Behavior: Behavior normal.        Thought Content: Thought content normal.           Assessment & Plan:

## 2023-02-01 NOTE — Assessment & Plan Note (Signed)
Pt is down 52 lbs since April.  She is following her gastric bypass diet and exercising regularly.  Applauded her efforts.  Will continue to follow.

## 2023-02-02 ENCOUNTER — Telehealth: Payer: Self-pay

## 2023-02-02 NOTE — Telephone Encounter (Signed)
-----   Message from Neena Rhymes sent at 02/02/2023  7:34 AM EDT ----- Labs are stable and look good.  I suspect your low WBC (white blood cell count) is due to your use of Enbrel.  No cause for concern at this time.

## 2023-03-23 ENCOUNTER — Other Ambulatory Visit: Payer: Self-pay | Admitting: Family Medicine

## 2023-04-09 ENCOUNTER — Other Ambulatory Visit: Payer: Self-pay | Admitting: Family Medicine

## 2023-05-03 ENCOUNTER — Other Ambulatory Visit: Payer: Self-pay | Admitting: Family Medicine

## 2023-05-10 ENCOUNTER — Other Ambulatory Visit: Payer: Self-pay | Admitting: Family Medicine

## 2023-05-10 DIAGNOSIS — E785 Hyperlipidemia, unspecified: Secondary | ICD-10-CM

## 2023-05-12 DIAGNOSIS — G8929 Other chronic pain: Secondary | ICD-10-CM | POA: Diagnosis not present

## 2023-05-12 DIAGNOSIS — M1711 Unilateral primary osteoarthritis, right knee: Secondary | ICD-10-CM | POA: Diagnosis not present

## 2023-05-12 DIAGNOSIS — M25561 Pain in right knee: Secondary | ICD-10-CM | POA: Diagnosis not present

## 2023-05-15 DIAGNOSIS — Z1151 Encounter for screening for human papillomavirus (HPV): Secondary | ICD-10-CM | POA: Diagnosis not present

## 2023-05-15 DIAGNOSIS — Z01419 Encounter for gynecological examination (general) (routine) without abnormal findings: Secondary | ICD-10-CM | POA: Diagnosis not present

## 2023-05-15 DIAGNOSIS — Z124 Encounter for screening for malignant neoplasm of cervix: Secondary | ICD-10-CM | POA: Diagnosis not present

## 2023-05-15 DIAGNOSIS — Z6841 Body Mass Index (BMI) 40.0 and over, adult: Secondary | ICD-10-CM | POA: Diagnosis not present

## 2023-05-15 DIAGNOSIS — Z1331 Encounter for screening for depression: Secondary | ICD-10-CM | POA: Diagnosis not present

## 2023-05-15 DIAGNOSIS — Z113 Encounter for screening for infections with a predominantly sexual mode of transmission: Secondary | ICD-10-CM | POA: Diagnosis not present

## 2023-05-16 DIAGNOSIS — Z113 Encounter for screening for infections with a predominantly sexual mode of transmission: Secondary | ICD-10-CM | POA: Diagnosis not present

## 2023-05-17 DIAGNOSIS — M170 Bilateral primary osteoarthritis of knee: Secondary | ICD-10-CM | POA: Diagnosis not present

## 2023-05-17 DIAGNOSIS — D84821 Immunodeficiency due to drugs: Secondary | ICD-10-CM | POA: Diagnosis not present

## 2023-05-17 DIAGNOSIS — Z79899 Other long term (current) drug therapy: Secondary | ICD-10-CM | POA: Diagnosis not present

## 2023-05-17 DIAGNOSIS — M0579 Rheumatoid arthritis with rheumatoid factor of multiple sites without organ or systems involvement: Secondary | ICD-10-CM | POA: Diagnosis not present

## 2023-06-14 ENCOUNTER — Other Ambulatory Visit: Payer: Self-pay | Admitting: Family Medicine

## 2023-06-27 DIAGNOSIS — Z803 Family history of malignant neoplasm of breast: Secondary | ICD-10-CM | POA: Diagnosis not present

## 2023-06-27 DIAGNOSIS — Z1231 Encounter for screening mammogram for malignant neoplasm of breast: Secondary | ICD-10-CM | POA: Diagnosis not present

## 2023-06-27 DIAGNOSIS — R92323 Mammographic fibroglandular density, bilateral breasts: Secondary | ICD-10-CM | POA: Diagnosis not present

## 2023-06-27 LAB — HM MAMMOGRAPHY

## 2023-06-30 ENCOUNTER — Encounter: Payer: Self-pay | Admitting: Family Medicine

## 2023-07-03 ENCOUNTER — Other Ambulatory Visit: Payer: Self-pay | Admitting: Family Medicine

## 2023-07-13 ENCOUNTER — Other Ambulatory Visit: Payer: Self-pay | Admitting: Family Medicine

## 2023-08-02 ENCOUNTER — Encounter: Payer: BC Managed Care – PPO | Admitting: Family Medicine

## 2023-08-07 ENCOUNTER — Encounter: Payer: Self-pay | Admitting: Family Medicine

## 2023-08-07 ENCOUNTER — Ambulatory Visit (INDEPENDENT_AMBULATORY_CARE_PROVIDER_SITE_OTHER): Payer: BC Managed Care – PPO | Admitting: Family Medicine

## 2023-08-07 VITALS — BP 138/78 | HR 55 | Temp 98.8°F | Ht 65.5 in | Wt 281.4 lb

## 2023-08-07 DIAGNOSIS — E559 Vitamin D deficiency, unspecified: Secondary | ICD-10-CM | POA: Diagnosis not present

## 2023-08-07 DIAGNOSIS — Z0000 Encounter for general adult medical examination without abnormal findings: Secondary | ICD-10-CM

## 2023-08-07 NOTE — Patient Instructions (Signed)
 Follow up in 6 months to recheck blood pressure and cholesterol We'll notify you of your lab results and make any changes if needed Keep up the good work on healthy diet and regular exercise- you're doing great!!! Call with any questions or concerns Stay Safe!  Stay Healthy! Happy Spring!!!

## 2023-08-07 NOTE — Progress Notes (Signed)
   Subjective:    Patient ID: Taylor Frye, female    DOB: Jul 08, 1980, 43 y.o.   MRN: [id number]  HPI CPE- UTD on pap, mammo, Tdap.  Pt is down 8 lbs since October- watching diet and moving more.  Patient Care Team    Relationship Specialty Notifications Start End  Jess Morita, MD PCP - General   03/31/10   Stevenson Elbe, MD (Inactive) Consulting Physician Obstetrics and Gynecology  07/15/15   Vernal Gold, MD Consulting Physician Obstetrics and Gynecology  07/14/20     Health Maintenance  Topic Date Due   Pneumococcal Vaccine 55-74 Years old (1 of 2 - PCV) Never done   COVID-19 Vaccine (3 - Pfizer risk series) 09/09/2019   INFLUENZA VACCINE  11/17/2023   Cervical Cancer Screening (HPV/Pap Cotest)  05/25/2024   DTaP/Tdap/Td (3 - Td or Tdap) 02/04/2032   Hepatitis C Screening  Completed   HIV Screening  Completed   HPV VACCINES  Aged Out   Meningococcal B Vaccine  Aged Out     Review of Systems Patient reports no vision/ hearing changes, adenopathy, fever, persistant/recurrent hoarseness , swallowing issues, chest pain, palpitations, edema, persistant/recurrent cough, hemoptysis, dyspnea (rest/exertional/paroxysmal nocturnal), gastrointestinal bleeding (melena, rectal bleeding), abdominal pain, significant heartburn, bowel changes, GU symptoms (dysuria, hematuria, incontinence), Gyn symptoms (abnormal  bleeding, pain),  syncope, focal weakness, memory loss, numbness & tingling, skin/hair/nail changes, abnormal bruising or bleeding, anxiety, or depression.     Objective:   Physical Exam General Appearance:    Alert, cooperative, no distress, appears stated age, obese  Head:    Normocephalic, without obvious abnormality, atraumatic  Eyes:    PERRL, conjunctiva/corneas clear, EOM's intact both eyes  Ears:    Normal TM's and external ear canals, both ears  Nose:   Nares normal, septum midline, mucosa normal, no drainage    or sinus tenderness  Throat:   Lips, mucosa, and  tongue normal; teeth and gums normal  Neck:   Supple, symmetrical, trachea midline, no adenopathy;    Thyroid : no enlargement/tenderness/nodules  Back:     Symmetric, no curvature, ROM normal, no CVA tenderness  Lungs:     Clear to auscultation bilaterally, respirations unlabored  Chest Wall:    No tenderness or deformity   Heart:    Regular rate and rhythm, S1 and S2 normal, no murmur, rub   or gallop  Breast Exam:    Deferred to GYN  Abdomen:     Soft, non-tender, bowel sounds active all four quadrants,    no masses, no organomegaly  Genitalia:    Deferred to GYN  Rectal:    Extremities:   Extremities normal, atraumatic, no cyanosis or edema  Pulses:   2+ and symmetric all extremities  Skin:   Skin color, texture, turgor normal, no rashes or lesions  Lymph nodes:   Cervical, supraclavicular, and axillary nodes normal  Neurologic:   CNII-XII intact, normal strength, sensation and reflexes    throughout          Assessment & Plan:

## 2023-08-07 NOTE — Assessment & Plan Note (Signed)
 Pt's PE WNL w/ exception of BMI.  She is down 70 lbs from last year s/p gastric bypass.  Applauded her efforts.  UTD on pap, mammo. Tdap.  Declines PNA at this time.  Check labs.  Anticipatory guidance provided.

## 2023-08-08 ENCOUNTER — Encounter: Payer: Self-pay | Admitting: Family Medicine

## 2023-08-08 LAB — LIPID PANEL
Cholesterol: 111 mg/dL (ref 0–200)
HDL: 41.5 mg/dL (ref >39.00)
LDL Cholesterol: 49 mg/dL (ref 0–99)
NonHDL: 69.49
Total CHOL/HDL Ratio: 3
Triglycerides: 103 mg/dL (ref 0.0–149.0)
VLDL: 20.6 mg/dL (ref 0.0–40.0)

## 2023-08-08 LAB — CBC WITH DIFFERENTIAL/PLATELET
Basophils Absolute: 0 10*3/uL (ref 0.0–0.1)
Basophils Relative: 0.6 % (ref 0.0–3.0)
Eosinophils Absolute: 0 10*3/uL (ref 0.0–0.7)
Eosinophils Relative: 1 % (ref 0.0–5.0)
HCT: 39.1 % (ref 36.0–46.0)
Hemoglobin: 13 g/dL (ref 12.0–15.0)
Lymphocytes Relative: 47.3 % — ABNORMAL HIGH (ref 12.0–46.0)
Lymphs Abs: 1.4 10*3/uL (ref 0.7–4.0)
MCHC: 33.2 g/dL (ref 30.0–36.0)
MCV: 98.1 fl (ref 78.0–100.0)
Monocytes Absolute: 0.4 10*3/uL (ref 0.1–1.0)
Monocytes Relative: 12.9 % — ABNORMAL HIGH (ref 3.0–12.0)
Neutro Abs: 1.2 10*3/uL — ABNORMAL LOW (ref 1.4–7.7)
Neutrophils Relative %: 38.2 % — ABNORMAL LOW (ref 43.0–77.0)
Platelets: 239 10*3/uL (ref 150.0–400.0)
RBC: 3.98 Mil/uL (ref 3.87–5.11)
RDW: 13.6 % (ref 11.5–15.5)
WBC: 3 10*3/uL — ABNORMAL LOW (ref 4.0–10.5)

## 2023-08-08 LAB — BASIC METABOLIC PANEL WITH GFR
BUN: 14 mg/dL (ref 6–23)
CO2: 24 meq/L (ref 19–32)
Calcium: 9.1 mg/dL (ref 8.4–10.5)
Chloride: 106 meq/L (ref 96–112)
Creatinine, Ser: 0.66 mg/dL (ref 0.40–1.20)
GFR: 107.19 mL/min
Glucose, Bld: 76 mg/dL (ref 70–99)
Potassium: 4.1 meq/L (ref 3.5–5.1)
Sodium: 136 meq/L (ref 135–145)

## 2023-08-08 LAB — TSH: TSH: 2.19 u[IU]/mL (ref 0.35–5.50)

## 2023-08-08 LAB — HEPATIC FUNCTION PANEL
ALT: 20 U/L (ref 0–35)
AST: 19 U/L (ref 0–37)
Albumin: 4.1 g/dL (ref 3.5–5.2)
Alkaline Phosphatase: 64 U/L (ref 39–117)
Bilirubin, Direct: 0 mg/dL (ref 0.0–0.3)
Total Bilirubin: 0.4 mg/dL (ref 0.2–1.2)
Total Protein: 7.5 g/dL (ref 6.0–8.3)

## 2023-08-08 LAB — VITAMIN D 25 HYDROXY (VIT D DEFICIENCY, FRACTURES): VITD: 34.25 ng/mL (ref 30.00–100.00)

## 2023-08-08 LAB — HEMOGLOBIN A1C: Hgb A1c MFr Bld: 5.3 % (ref 4.6–6.5)

## 2023-08-09 ENCOUNTER — Telehealth: Payer: Self-pay

## 2023-08-09 NOTE — Telephone Encounter (Signed)
 Pt has reviewed labs via MyChart

## 2023-08-09 NOTE — Telephone Encounter (Signed)
-----   Message from Laymon Priest sent at 08/08/2023  5:14 PM EDT ----- Labs look good!  Your blood counts are all closer to the normal ranges- this is great news!  Keep up the good work!

## 2023-08-14 DIAGNOSIS — Z113 Encounter for screening for infections with a predominantly sexual mode of transmission: Secondary | ICD-10-CM | POA: Diagnosis not present

## 2023-08-18 ENCOUNTER — Other Ambulatory Visit: Payer: Self-pay | Admitting: Family Medicine

## 2023-08-18 DIAGNOSIS — E785 Hyperlipidemia, unspecified: Secondary | ICD-10-CM

## 2023-08-21 ENCOUNTER — Other Ambulatory Visit: Payer: Self-pay | Admitting: Family Medicine

## 2023-08-21 DIAGNOSIS — E785 Hyperlipidemia, unspecified: Secondary | ICD-10-CM

## 2023-08-23 DIAGNOSIS — J4530 Mild persistent asthma, uncomplicated: Secondary | ICD-10-CM | POA: Diagnosis not present

## 2023-08-23 DIAGNOSIS — E66813 Obesity, class 3: Secondary | ICD-10-CM | POA: Diagnosis not present

## 2023-08-23 DIAGNOSIS — G4733 Obstructive sleep apnea (adult) (pediatric): Secondary | ICD-10-CM | POA: Diagnosis not present

## 2023-08-23 DIAGNOSIS — Z9884 Bariatric surgery status: Secondary | ICD-10-CM | POA: Diagnosis not present

## 2023-09-21 DIAGNOSIS — Z9884 Bariatric surgery status: Secondary | ICD-10-CM | POA: Diagnosis not present

## 2023-09-21 DIAGNOSIS — R634 Abnormal weight loss: Secondary | ICD-10-CM | POA: Diagnosis not present

## 2023-09-21 DIAGNOSIS — Z6841 Body Mass Index (BMI) 40.0 and over, adult: Secondary | ICD-10-CM | POA: Diagnosis not present

## 2023-09-28 ENCOUNTER — Other Ambulatory Visit: Payer: Self-pay | Admitting: Family Medicine

## 2023-10-02 ENCOUNTER — Other Ambulatory Visit: Payer: Self-pay | Admitting: Family Medicine

## 2023-10-07 ENCOUNTER — Encounter: Payer: Self-pay | Admitting: Family Medicine

## 2023-10-09 ENCOUNTER — Other Ambulatory Visit: Payer: Self-pay | Admitting: Family Medicine

## 2023-10-09 ENCOUNTER — Other Ambulatory Visit: Payer: Self-pay

## 2023-10-09 MED ORDER — QVAR REDIHALER 80 MCG/ACT IN AERB: 1.0000 | INHALATION_SPRAY | Freq: Two times a day (BID) | RESPIRATORY_TRACT | 0 refills | Status: DC

## 2023-10-16 DIAGNOSIS — M0579 Rheumatoid arthritis with rheumatoid factor of multiple sites without organ or systems involvement: Secondary | ICD-10-CM | POA: Diagnosis not present

## 2023-10-16 DIAGNOSIS — D72819 Decreased white blood cell count, unspecified: Secondary | ICD-10-CM | POA: Diagnosis not present

## 2023-11-01 DIAGNOSIS — D72819 Decreased white blood cell count, unspecified: Secondary | ICD-10-CM | POA: Diagnosis not present

## 2023-11-16 DIAGNOSIS — M0579 Rheumatoid arthritis with rheumatoid factor of multiple sites without organ or systems involvement: Secondary | ICD-10-CM | POA: Diagnosis not present

## 2023-12-15 ENCOUNTER — Other Ambulatory Visit: Payer: Self-pay | Admitting: Family Medicine

## 2023-12-15 DIAGNOSIS — E785 Hyperlipidemia, unspecified: Secondary | ICD-10-CM

## 2023-12-21 DIAGNOSIS — D84821 Immunodeficiency due to drugs: Secondary | ICD-10-CM | POA: Diagnosis not present

## 2023-12-21 DIAGNOSIS — Z79899 Other long term (current) drug therapy: Secondary | ICD-10-CM | POA: Diagnosis not present

## 2023-12-21 DIAGNOSIS — M0579 Rheumatoid arthritis with rheumatoid factor of multiple sites without organ or systems involvement: Secondary | ICD-10-CM | POA: Diagnosis not present

## 2023-12-29 ENCOUNTER — Other Ambulatory Visit: Payer: Self-pay | Admitting: Family Medicine

## 2024-01-25 ENCOUNTER — Other Ambulatory Visit: Payer: Self-pay | Admitting: Family Medicine

## 2024-02-07 ENCOUNTER — Encounter: Payer: Self-pay | Admitting: Family Medicine

## 2024-02-07 ENCOUNTER — Ambulatory Visit: Admitting: Family Medicine

## 2024-02-07 VITALS — BP 130/78 | HR 51 | Temp 97.9°F | Ht 65.5 in | Wt 279.1 lb

## 2024-02-07 DIAGNOSIS — I10 Essential (primary) hypertension: Secondary | ICD-10-CM

## 2024-02-07 DIAGNOSIS — Z9884 Bariatric surgery status: Secondary | ICD-10-CM

## 2024-02-07 DIAGNOSIS — E785 Hyperlipidemia, unspecified: Secondary | ICD-10-CM | POA: Diagnosis not present

## 2024-02-07 DIAGNOSIS — Z23 Encounter for immunization: Secondary | ICD-10-CM

## 2024-02-07 LAB — CBC WITH DIFFERENTIAL/PLATELET
Basophils Absolute: 0 K/uL (ref 0.0–0.1)
Basophils Relative: 0.5 % (ref 0.0–3.0)
Eosinophils Absolute: 0 K/uL (ref 0.0–0.7)
Eosinophils Relative: 1 % (ref 0.0–5.0)
HCT: 36.1 % (ref 36.0–46.0)
Hemoglobin: 11.8 g/dL — ABNORMAL LOW (ref 12.0–15.0)
Lymphocytes Relative: 54.9 % — ABNORMAL HIGH (ref 12.0–46.0)
Lymphs Abs: 1.2 K/uL (ref 0.7–4.0)
MCHC: 32.6 g/dL (ref 30.0–36.0)
MCV: 92.9 fl (ref 78.0–100.0)
Monocytes Absolute: 0.4 K/uL (ref 0.1–1.0)
Monocytes Relative: 15.9 % — ABNORMAL HIGH (ref 3.0–12.0)
Neutro Abs: 0.6 K/uL — ABNORMAL LOW (ref 1.4–7.7)
Neutrophils Relative %: 27.7 % — ABNORMAL LOW (ref 43.0–77.0)
Platelets: 265 K/uL (ref 150.0–400.0)
RBC: 3.89 Mil/uL (ref 3.87–5.11)
RDW: 15.4 % (ref 11.5–15.5)
WBC: 2.3 K/uL — ABNORMAL LOW (ref 4.0–10.5)

## 2024-02-07 LAB — LIPID PANEL
Cholesterol: 111 mg/dL (ref 0–200)
HDL: 47.3 mg/dL (ref >39.00)
LDL Cholesterol: 50 mg/dL (ref 0–99)
NonHDL: 63.43
Total CHOL/HDL Ratio: 2
Triglycerides: 68 mg/dL (ref 0.0–149.0)
VLDL: 13.6 mg/dL (ref 0.0–40.0)

## 2024-02-07 LAB — BASIC METABOLIC PANEL WITH GFR
BUN: 13 mg/dL (ref 6–23)
CO2: 26 meq/L (ref 19–32)
Calcium: 9 mg/dL (ref 8.4–10.5)
Chloride: 107 meq/L (ref 96–112)
Creatinine, Ser: 0.75 mg/dL (ref 0.40–1.20)
GFR: 96.94 mL/min (ref >60.00)
Glucose, Bld: 87 mg/dL (ref 70–99)
Potassium: 4.1 meq/L (ref 3.5–5.1)
Sodium: 139 meq/L (ref 135–145)

## 2024-02-07 LAB — HEPATIC FUNCTION PANEL
ALT: 17 U/L (ref 0–35)
AST: 18 U/L (ref 0–37)
Albumin: 4.2 g/dL (ref 3.5–5.2)
Alkaline Phosphatase: 63 U/L (ref 39–117)
Bilirubin, Direct: 0.1 mg/dL (ref 0.0–0.3)
Total Bilirubin: 0.4 mg/dL (ref 0.2–1.2)
Total Protein: 7.3 g/dL (ref 6.0–8.3)

## 2024-02-07 LAB — TSH: TSH: 1.69 u[IU]/mL (ref 0.35–5.50)

## 2024-02-07 MED ORDER — ALBUTEROL SULFATE HFA 108 (90 BASE) MCG/ACT IN AERS: 2.0000 | INHALATION_SPRAY | Freq: Four times a day (QID) | RESPIRATORY_TRACT | 0 refills | Status: AC | PRN

## 2024-02-07 NOTE — Assessment & Plan Note (Signed)
 Ongoing issue but pt is doing remarkably since having gastric bypass last year.  She is considering surgery to remove excess skin- will refer to Plastic surgery for consultation.  Applauded her efforts.  Will continue to follow.

## 2024-02-07 NOTE — Assessment & Plan Note (Signed)
 Chronic problem.  On Losartan  100mg  daily w/ good control.  Currently asymptomatic.  Check labs due to ARB use but no anticipated med changes.  Will follow.

## 2024-02-07 NOTE — Patient Instructions (Signed)
Schedule your complete physical in 6 months We'll notify you of your lab results and make any changes if needed Keep up the good work on healthy diet and regular exercise- you look great!! Call with any questions or concerns Stay Safe!  Stay Healthy! Happy Fall!! 

## 2024-02-07 NOTE — Progress Notes (Signed)
   Subjective:    Patient ID: Taylor Frye, female    DOB: 04-15-1981, 43 y.o.   MRN: [id number]  HPI HTN- chronic problem, on Losartan  100mg  daily.  Pt reports feeling 'great'.  No CP, SOB, HAs, visual changes, edema.  Hyperlipidemia- chronic problem, on Crestor  10mg  daily. No abd pain, N/V.  Obesity- ongoing issue.  Weight and BMI are stable at 279 lbs and 45.74 respectively.  Continues to exercise- walking or going to gym.   Review of Systems For ROS see HPI     Objective:   Physical Exam Vitals reviewed.  Constitutional:      General: She is not in acute distress.    Appearance: Normal appearance. She is well-developed. She is obese. She is not ill-appearing.  HENT:     Head: Normocephalic and atraumatic.  Eyes:     Conjunctiva/sclera: Conjunctivae normal.     Pupils: Pupils are equal, round, and reactive to light.  Neck:     Thyroid : No thyromegaly.  Cardiovascular:     Rate and Rhythm: Normal rate and regular rhythm.     Pulses: Normal pulses.     Heart sounds: Normal heart sounds. No murmur heard. Pulmonary:     Effort: Pulmonary effort is normal. No respiratory distress.     Breath sounds: Normal breath sounds.  Abdominal:     General: There is no distension.     Palpations: Abdomen is soft.     Tenderness: There is no abdominal tenderness.  Musculoskeletal:     Cervical back: Normal range of motion and neck supple.     Right lower leg: No edema.     Left lower leg: No edema.  Lymphadenopathy:     Cervical: No cervical adenopathy.  Skin:    General: Skin is warm and dry.  Neurological:     General: No focal deficit present.     Mental Status: She is alert and oriented to person, place, and time.  Psychiatric:        Mood and Affect: Mood normal.        Behavior: Behavior normal.        Thought Content: Thought content normal.           Assessment & Plan:

## 2024-02-07 NOTE — Assessment & Plan Note (Signed)
 Chronic problem.  On Crestor  10mg  daily w/o difficulty.  Check labs.  Adjust meds prn

## 2024-02-08 ENCOUNTER — Ambulatory Visit: Payer: Self-pay | Admitting: Family Medicine

## 2024-02-21 DIAGNOSIS — R7689 Other specified abnormal immunological findings in serum: Secondary | ICD-10-CM | POA: Diagnosis not present

## 2024-02-21 DIAGNOSIS — D84821 Immunodeficiency due to drugs: Secondary | ICD-10-CM | POA: Diagnosis not present

## 2024-02-21 DIAGNOSIS — Z86711 Personal history of pulmonary embolism: Secondary | ICD-10-CM | POA: Diagnosis not present

## 2024-02-21 DIAGNOSIS — D72819 Decreased white blood cell count, unspecified: Secondary | ICD-10-CM | POA: Diagnosis not present

## 2024-02-21 DIAGNOSIS — M0579 Rheumatoid arthritis with rheumatoid factor of multiple sites without organ or systems involvement: Secondary | ICD-10-CM | POA: Diagnosis not present

## 2024-02-22 ENCOUNTER — Ambulatory Visit: Admitting: Plastic Surgery

## 2024-02-22 VITALS — BP 130/94 | HR 77 | Temp 98.1°F | Ht 65.5 in | Wt 280.4 lb

## 2024-02-22 DIAGNOSIS — L987 Excessive and redundant skin and subcutaneous tissue: Secondary | ICD-10-CM | POA: Diagnosis not present

## 2024-02-22 DIAGNOSIS — E65 Localized adiposity: Secondary | ICD-10-CM | POA: Diagnosis not present

## 2024-02-22 DIAGNOSIS — Z6841 Body Mass Index (BMI) 40.0 and over, adult: Secondary | ICD-10-CM | POA: Diagnosis not present

## 2024-02-22 NOTE — Progress Notes (Signed)
 Taylor Frye is a 43 year old female who is referred for consideration of a panniculectomy.  Today her BMI is 45.9.  This is well above the maximum limit of 40 for panniculectomy.  Additionally she has been on Xarelto  for DVTs and PEs for 6 years now.  She is told that she had a very severe problem and likely will not be able to come off of Xarelto .   I discussed with her the fact that it is not possible to perform the surgery while someone is anticoagulated due to the extensive soft tissue dissection required for panniculectomy.  If she were to lose weight to the point that she would be a candidate from a weight standpoint I believe that she would be best served by discussing panniculectomy with a university setting though I doubt that they would agree to perform this procedure either given the nature of her hypercoagulability problems.

## 2024-03-04 ENCOUNTER — Encounter: Payer: Self-pay | Admitting: Family Medicine

## 2024-03-04 MED ORDER — QVAR REDIHALER 80 MCG/ACT IN AERB: 1.0000 | INHALATION_SPRAY | Freq: Two times a day (BID) | RESPIRATORY_TRACT | 3 refills | Status: AC

## 2024-03-07 ENCOUNTER — Other Ambulatory Visit: Payer: Self-pay | Admitting: Family Medicine

## 2024-03-07 DIAGNOSIS — E785 Hyperlipidemia, unspecified: Secondary | ICD-10-CM

## 2024-03-08 DIAGNOSIS — D84821 Immunodeficiency due to drugs: Secondary | ICD-10-CM | POA: Diagnosis not present

## 2024-03-08 DIAGNOSIS — M0579 Rheumatoid arthritis with rheumatoid factor of multiple sites without organ or systems involvement: Secondary | ICD-10-CM | POA: Diagnosis not present

## 2024-03-27 ENCOUNTER — Other Ambulatory Visit: Payer: Self-pay | Admitting: Family Medicine

## 2024-03-28 DIAGNOSIS — Z6841 Body Mass Index (BMI) 40.0 and over, adult: Secondary | ICD-10-CM | POA: Diagnosis not present

## 2024-03-28 DIAGNOSIS — Z9884 Bariatric surgery status: Secondary | ICD-10-CM | POA: Diagnosis not present

## 2024-03-28 DIAGNOSIS — M0579 Rheumatoid arthritis with rheumatoid factor of multiple sites without organ or systems involvement: Secondary | ICD-10-CM | POA: Diagnosis not present

## 2024-04-09 DIAGNOSIS — Z9884 Bariatric surgery status: Secondary | ICD-10-CM | POA: Diagnosis not present

## 2024-08-07 ENCOUNTER — Encounter: Admitting: Family Medicine
